# Patient Record
Sex: Female | Born: 1955 | Race: White | Hispanic: No | Marital: Married | Smoking: Former smoker
Health system: Southern US, Community
[De-identification: ages and names within clinical notes are randomized; demographics above are authoritative.]

## PROBLEM LIST (undated history)

## (undated) DIAGNOSIS — R42 Dizziness and giddiness: Secondary | ICD-10-CM

## (undated) DIAGNOSIS — E039 Hypothyroidism, unspecified: Secondary | ICD-10-CM

## (undated) DIAGNOSIS — E0500 Thyrotoxicosis with diffuse goiter without thyrotoxic crisis or storm: Secondary | ICD-10-CM

## (undated) DIAGNOSIS — Z9189 Other specified personal risk factors, not elsewhere classified: Secondary | ICD-10-CM

## (undated) DIAGNOSIS — Z808 Family history of malignant neoplasm of other organs or systems: Secondary | ICD-10-CM

## (undated) DIAGNOSIS — IMO0002 Reserved for concepts with insufficient information to code with codable children: Secondary | ICD-10-CM

## (undated) DIAGNOSIS — F32A Depression, unspecified: Secondary | ICD-10-CM

## (undated) DIAGNOSIS — E079 Disorder of thyroid, unspecified: Secondary | ICD-10-CM

## (undated) DIAGNOSIS — F329 Major depressive disorder, single episode, unspecified: Secondary | ICD-10-CM

## (undated) DIAGNOSIS — Z803 Family history of malignant neoplasm of breast: Secondary | ICD-10-CM

## (undated) DIAGNOSIS — B009 Herpesviral infection, unspecified: Secondary | ICD-10-CM

## (undated) HISTORY — PX: CATARACT EXTRACTION: SUR2

## (undated) HISTORY — DX: Depression, unspecified: F32.A

## (undated) HISTORY — DX: Dizziness and giddiness: R42

## (undated) HISTORY — DX: Major depressive disorder, single episode, unspecified: F32.9

## (undated) HISTORY — DX: Herpesviral infection, unspecified: B00.9

## (undated) HISTORY — PX: KIDNEY DONATION: SHX685

## (undated) HISTORY — DX: Family history of malignant neoplasm of other organs or systems: Z80.8

## (undated) HISTORY — DX: Family history of malignant neoplasm of breast: Z80.3

## (undated) HISTORY — PX: BREAST BIOPSY: SHX20

## (undated) HISTORY — DX: Disorder of thyroid, unspecified: E07.9

## (undated) HISTORY — DX: Other specified personal risk factors, not elsewhere classified: Z91.89

## (undated) HISTORY — PX: CATARACT EXTRACTION W/ INTRAOCULAR LENS IMPLANT: SHX1309

---

## 1994-09-28 DIAGNOSIS — E0500 Thyrotoxicosis with diffuse goiter without thyrotoxic crisis or storm: Secondary | ICD-10-CM

## 1994-09-28 HISTORY — DX: Thyrotoxicosis with diffuse goiter without thyrotoxic crisis or storm: E05.00

## 1999-03-25 ENCOUNTER — Other Ambulatory Visit: Admission: RE | Admit: 1999-03-25 | Discharge: 1999-03-25 | Payer: Self-pay | Admitting: Obstetrics and Gynecology

## 2000-05-22 ENCOUNTER — Other Ambulatory Visit: Admission: RE | Admit: 2000-05-22 | Discharge: 2000-05-22 | Payer: Self-pay | Admitting: Obstetrics and Gynecology

## 2001-07-19 ENCOUNTER — Other Ambulatory Visit: Admission: RE | Admit: 2001-07-19 | Discharge: 2001-07-19 | Payer: Self-pay | Admitting: Obstetrics and Gynecology

## 2002-07-21 ENCOUNTER — Other Ambulatory Visit: Admission: RE | Admit: 2002-07-21 | Discharge: 2002-07-21 | Payer: Self-pay | Admitting: Obstetrics and Gynecology

## 2003-08-02 ENCOUNTER — Other Ambulatory Visit: Admission: RE | Admit: 2003-08-02 | Discharge: 2003-08-02 | Payer: Self-pay | Admitting: Obstetrics and Gynecology

## 2004-08-26 ENCOUNTER — Other Ambulatory Visit: Admission: RE | Admit: 2004-08-26 | Discharge: 2004-08-26 | Payer: Self-pay | Admitting: Obstetrics and Gynecology

## 2004-10-22 ENCOUNTER — Encounter: Admission: RE | Admit: 2004-10-22 | Discharge: 2004-11-11 | Payer: Self-pay | Admitting: Family Medicine

## 2005-08-26 ENCOUNTER — Other Ambulatory Visit: Admission: RE | Admit: 2005-08-26 | Discharge: 2005-08-26 | Payer: Self-pay | Admitting: Obstetrics and Gynecology

## 2006-03-19 ENCOUNTER — Encounter: Admission: RE | Admit: 2006-03-19 | Discharge: 2006-03-19 | Payer: Self-pay | Admitting: Radiology

## 2006-09-15 ENCOUNTER — Encounter: Admission: RE | Admit: 2006-09-15 | Discharge: 2006-09-15 | Payer: Self-pay | Admitting: Radiology

## 2006-10-19 ENCOUNTER — Other Ambulatory Visit: Admission: RE | Admit: 2006-10-19 | Discharge: 2006-10-19 | Payer: Self-pay | Admitting: Obstetrics and Gynecology

## 2006-10-20 LAB — HIV ANTIBODY (ROUTINE TESTING W REFLEX): HIV: NEGATIVE

## 2007-10-21 ENCOUNTER — Other Ambulatory Visit: Admission: RE | Admit: 2007-10-21 | Discharge: 2007-10-21 | Payer: Self-pay | Admitting: Obstetrics & Gynecology

## 2009-02-22 HISTORY — PX: COLONOSCOPY W/ BIOPSIES: SHX1374

## 2010-10-10 ENCOUNTER — Ambulatory Visit: Payer: Self-pay | Admitting: Physical Medicine and Rehabilitation

## 2011-08-27 ENCOUNTER — Other Ambulatory Visit: Payer: Self-pay | Admitting: Obstetrics & Gynecology

## 2011-08-27 DIAGNOSIS — R109 Unspecified abdominal pain: Secondary | ICD-10-CM

## 2011-09-01 ENCOUNTER — Ambulatory Visit
Admission: RE | Admit: 2011-09-01 | Discharge: 2011-09-01 | Disposition: A | Discharge status: Home / Self Care | Payer: BC Managed Care – PPO | Source: Ambulatory Visit | Attending: Obstetrics & Gynecology | Admitting: Obstetrics & Gynecology

## 2011-09-01 DIAGNOSIS — R109 Unspecified abdominal pain: Secondary | ICD-10-CM

## 2011-09-01 MED ORDER — IOHEXOL 300 MG/ML  SOLN: 100.0000 mL | Freq: Once | INTRAMUSCULAR | Status: AC | PRN

## 2011-09-18 ENCOUNTER — Encounter (INDEPENDENT_AMBULATORY_CARE_PROVIDER_SITE_OTHER): Payer: Self-pay | Admitting: General Surgery

## 2011-09-19 ENCOUNTER — Encounter (INDEPENDENT_AMBULATORY_CARE_PROVIDER_SITE_OTHER): Payer: Self-pay | Admitting: General Surgery

## 2011-09-19 ENCOUNTER — Ambulatory Visit (INDEPENDENT_AMBULATORY_CARE_PROVIDER_SITE_OTHER): Payer: BC Managed Care – PPO | Admitting: General Surgery

## 2011-09-19 VITALS — BP 138/90 | HR 80 | Temp 98.0°F | Resp 12 | Ht 60.0 in | Wt 144.0 lb

## 2011-09-19 DIAGNOSIS — K388 Other specified diseases of appendix: Secondary | ICD-10-CM | POA: Insufficient documentation

## 2011-09-19 DIAGNOSIS — K389 Disease of appendix, unspecified: Secondary | ICD-10-CM

## 2011-09-19 NOTE — Progress Notes (Signed)
Patient ID: Kimberly Dorsey, female   DOB: [birth year], 56 y.o.   MRN: [id number]  Chief Complaint  Patient presents with  . Pre-op Exam    eval abd appy    HPI Kimberly Dorsey is a 56 y.o. female.  She is referred by Dr. 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 Dr. Viviann Spare sounds her endocrinologist.  The patient is generally healthy. She takes Synthroid for hypothyroidism. Last colonoscopy 2011 by Dr. Loreta Ave revealing polyps. Follow up in 5 years advised  Current problem is a 3 month history of intermittent right lower quadrant pain. Pain is somewhat sharp, can be  brief. The pain is increased by walking or sitting and goes away when she lies down. No history of endometriosis or Crohn's disease. No alteration of gastrointestinal or voiding habits. No fever or chills.  Transvaginal ultrasound was negative. CT scan showed abnormal appearance of the distal appendix but without any evidence of inflammation or adenopathy. Appendiceal tumor could not be excluded.  She is here with her husband to discuss management. HPI  Past Medical History  Diagnosis Date  . Thyroid disease     History reviewed. No pertinent past surgical history.  Family History  Problem Relation Age of Onset  . Cancer Mother     breast  . Hyperlipidemia Father   . Diabetes Father   . Hyperlipidemia Sister   . Diabetes Sister     Social History History  Substance Use Topics  . Smoking status: Former Games developer  . Smokeless tobacco: Never Used  . Alcohol Use: 0.0 oz/week     couple per week    No Known Allergies  Current Outpatient Prescriptions  Medication Sig Dispense Refill  . CYMBALTA 60 MG capsule       . traMADol (ULTRAM) 50 MG tablet         Review of Systems Review of Systems  Constitutional: Negative for fever, chills and unexpected weight change.  HENT: Negative for hearing loss, congestion, sore throat, trouble swallowing and voice  change.   Eyes: Negative for visual disturbance.  Respiratory: Negative for cough and wheezing.   Cardiovascular: Negative for chest pain, palpitations and leg swelling.  Gastrointestinal: Positive for abdominal pain. Negative for nausea, vomiting, diarrhea, constipation, blood in stool, abdominal distention and anal bleeding.  Genitourinary: Negative for hematuria, vaginal bleeding and difficulty urinating.  Musculoskeletal: Negative for arthralgias.  Skin: Negative for rash and wound.  Neurological: Negative for seizures, syncope and headaches.  Hematological: Negative for adenopathy. Does not bruise/bleed easily.  Psychiatric/Behavioral: Negative for confusion.    Blood pressure 138/90, pulse 80, temperature 98 F (36.7 C), temperature source Temporal, resp. rate 12, height 5' (1.524 m), weight 144 lb (65.318 kg).  Physical Exam Physical Exam  Constitutional: She is oriented to person, place, and time. She appears well-developed and well-nourished. No distress.  HENT:  Head: Normocephalic and atraumatic.  Nose: Nose normal.  Mouth/Throat: No oropharyngeal exudate.  Eyes: Conjunctivae and EOM are normal. Pupils are equal, round, and reactive to light. Left eye exhibits no discharge. No scleral icterus.  Neck: Neck supple. No JVD present. No tracheal deviation present. No thyromegaly present.  Cardiovascular: Normal rate, regular rhythm, normal heart sounds and intact distal pulses.   No murmur heard. Pulmonary/Chest: Effort normal and breath sounds normal. No respiratory distress. She has no wheezes. She has no rales. She exhibits no tenderness.  Abdominal: Soft. Bowel sounds are  normal. She exhibits no distension and no mass. There is no tenderness. There is no rebound and no guarding.       Subjectively tender in the right lower quadrant, no guarding, no mass. Not severe. No scars. No hernias.  Musculoskeletal: She exhibits no edema and no tenderness.  Lymphadenopathy:    She has  no cervical adenopathy.  Neurological: She is alert and oriented to person, place, and time. She exhibits normal muscle tone. Coordination normal.  Skin: Skin is warm. No rash noted. She is not diaphoretic. No erythema. No pallor.  Psychiatric: She has a normal mood and affect. Her behavior is normal. Judgment and thought content normal.    Data Reviewed Dr. Rondel Baton office notes. CT scan reviewed.  Assessment    Right lower quadrant abdominal pain, etiology unclear.  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 Differential diagnosis includes normal appendix, chronic appendicitis, Crohn's disease, carcinoid, adenocarcinoma.    Plan    The patient was advised to have an appendectomy and she and her husband agree with this  She will be scheduled for laparoscopic appendectomy, possible open, in the near future.  I discussed the indications, details, differential diagnoses, techniques, and numerous risks of this procedure. She understands these issues. Her questions were answered. She agrees with this plan.       Angelia Mould. Derrell Lolling, M.D., [facility] General and Minimally invasive Surgery Breast and Colorectal Surgery Office:   [phone] Pager:   [phone]  09/19/2011, 5:12 PM

## 2011-09-19 NOTE — Patient Instructions (Signed)
Your CT scan strongly suggests that there is an enlargement and possible mass of the tip of your appendix. As we discussed, there are cervical possible causes for this. No other abnormalities are seen on the CT scan.  Because your pain is in the right lower quadrant, where the appendix is, you are advised to have an appendectomy to remove the appendix. The surgery will clarify the diagnosis and possibly will treat your pain.  He will be scheduled for a laparoscopic appendectomy, possible open appendectomy.   Laparoscopic Appendectomy Appendectomy is surgery to remove the appendix. Laparoscopic surgery uses several small cuts (incisions) instead of one large incision. Laparoscopic surgery offers a shorter recovery time and less discomfort. LET YOUR CAREGIVER KNOW ABOUT:  Allergies to food or medicine.   Medicines taken, including vitamins, dietary supplements, herbs, eyedrops, over-the-counter medicines, and creams.   Use of steroids (by mouth or creams).   Previous problems with anesthetics or numbing medicines.   History of bleeding problems or blood clots.   Previous surgery.   Other health problems, including diabetes, heart problems, lung problems, and kidney problems.   Possibility of pregnancy, if this applies.  RISKS AND COMPLICATIONS  Infection. A germ starts growing in the wound. This can usually be treated with antibiotics. In some cases, the wound will need to be opened and cleaned.   Bleeding.   Damage to other organs.   Sores (abscesses).   Chronic pain at the incision sites. This is defined as pain that lasts for more than 3 months.   Blood clots in the legs that may rarely travel to the lungs.   Infection in the lungs (pneumonia).  BEFORE THE PROCEDURE Appendectomy is usually performed immediately after an inflamed appendix (appendicitis) is diagnosed. No preparation is necessary ahead of this procedure. PROCEDURE  You will be given medicine that makes you  sleep (general anesthetic). After you are asleep, a flexible tube (catheter) may be inserted into your bladder to drain your urine during surgery. The tube is removed before you wake up after surgery. When you are asleep, carbondioxide gas will be used to inflate your abdomen. This will allow your surgeon to see inside your abdomen and perform your surgery. Three small incisions will be made in your abdomen. Your surgeon will insert a thin, lighted tube (laparoscope) through one of the incisions. Your surgeon will look through the laparoscope while performing the surgery. Other tools will be inserted through the other incisions. Laparoscopic procedures may not be appropriate when:  There is major scarring from a previous surgery.   The patient has bleeding disorders.   A pregnancy is near term.   There are other conditions which make the laparoscopic procedure impossible, such as an advanced infection or a ruptured appendix.  If your surgeon feels it is not safe to continue with the laparoscopic procedure, he or she will perform an open surgery instead. This gives the surgeon a larger view and more space to work. Open surgery requires a longer recovery time. After your appendix is removed, your incisions will be closed with stitches (sutures) or skin adhesive. AFTER THE PROCEDURE You will be taken to a recovery room. When the anesthesia has worn off, you will be returned to your hospital room. You will be given pain medicines to keep you comfortable. Ask your caregiver how Shadd your hospital stay will be. Document Released: 08/28/2003 Document Revised: 01/02/2011 Document Reviewed: 07/23/2010 [facility] Patient Information 2012 [place], [place].

## 2011-09-22 ENCOUNTER — Encounter (HOSPITAL_COMMUNITY): Payer: Self-pay | Admitting: Pharmacy Technician

## 2011-09-22 NOTE — Patient Instructions (Signed)
20 IREAN KENDRICKS  09/22/2011   Your procedure is scheduled on:  10/03/11 0900am-1015am  Report to [facility] at 0630 AM.  Call this number if you have problems the morning of surgery: [phone]   Remember:   Do not eat food:After Midnight.  May have clear liquids:until Midnight .   Take these medicines the morning of surgery with A SIP OF WATER:    Do not wear jewelry, make-up or nail polish.  Do not wear lotions, powders, or perfumes.   Do not shave 48 hours prior to surgery.   Do not bring valuables to the hospital.  Contacts, dentures or bridgework may not be worn into surgery.     Patients discharged the day of surgery will not be allowed to drive home.  Name and phone number of your driver:  Special Instructions: CHG Shower Use Special Wash: 1/2 bottle night before surgery and 1/2 bottle morning of surgery. shower chin to toes with CHG.  Wash face and private parts with regular soap.    Please read over the following fact sheets that you were given: MRSA Information, coughing and deep breathing exercises, leg exercises

## 2011-09-23 ENCOUNTER — Encounter (HOSPITAL_COMMUNITY)
Admission: RE | Admit: 2011-09-23 | Discharge: 2011-09-23 | Disposition: A | Discharge status: Home / Self Care | Payer: BC Managed Care – PPO | Source: Ambulatory Visit | Attending: General Surgery | Admitting: General Surgery

## 2011-09-23 ENCOUNTER — Encounter (HOSPITAL_COMMUNITY): Payer: Self-pay

## 2011-09-23 HISTORY — DX: Hypothyroidism, unspecified: E03.9

## 2011-09-23 HISTORY — DX: Thyrotoxicosis with diffuse goiter without thyrotoxic crisis or storm: E05.00

## 2011-09-23 HISTORY — DX: Reserved for concepts with insufficient information to code with codable children: IMO0002

## 2011-09-23 LAB — URINALYSIS, ROUTINE W REFLEX MICROSCOPIC
Bilirubin Urine: NEGATIVE
Hgb urine dipstick: NEGATIVE
Ketones, ur: NEGATIVE mg/dL
Leukocytes, UA: NEGATIVE
Protein, ur: NEGATIVE mg/dL
Specific Gravity, Urine: 1.015 (ref 1.005–1.030)
Urobilinogen, UA: 0.2 mg/dL (ref 0.0–1.0)

## 2011-09-23 LAB — COMPREHENSIVE METABOLIC PANEL
Albumin: 4.1 g/dL (ref 3.5–5.2)
BUN: 16 mg/dL (ref 6–23)
Calcium: 9.8 mg/dL (ref 8.4–10.5)
Chloride: 101 mEq/L (ref 96–112)
Creatinine, Ser: 0.62 mg/dL (ref 0.50–1.10)
GFR calc Af Amer: >90 mL/min (ref >90)
Sodium: 139 mEq/L (ref 135–145)
Total Protein: 7.1 g/dL (ref 6.0–8.3)

## 2011-09-23 LAB — CBC WITH DIFFERENTIAL/PLATELET
Basophils Absolute: 0 10*3/uL (ref 0.0–0.1)
Basophils Relative: 0 % (ref 0–1)
Lymphs Abs: 3.7 10*3/uL (ref 0.7–4.0)
Monocytes Absolute: 1 10*3/uL (ref 0.1–1.0)
WBC: 10.4 10*3/uL (ref 4.0–10.5)

## 2011-09-23 LAB — SURGICAL PCR SCREEN: MRSA, PCR: NEGATIVE

## 2011-10-02 NOTE — H&P (Signed)
Kimberly Dorsey    MRN: [id number]   Description: 56 year old female  Provider: Ernestene Mention, MD  Department: [facility]      Diagnoses     Mass of appendix   - Primary    543.9      Vitals -      BP Pulse Temp Resp Ht Wt    138/90 80 98 F (36.7 C) (Temporal) 12 5' (1.524 m) 144 lb (65.318 kg)    BMI - 28.12 kg/m2                 History and Physical     Ernestene Mention, MD   Patient ID: Kimberly Dorsey, female   DOB: [birth year], 56 y.o.   MRN: [id number]                 HPI Kimberly Dorsey is a 56 y.o. female.  She is referred by Dr. 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 Dr. Viviann Spare sounds her endocrinologist.   The patient is generally healthy. She takes Synthroid for hypothyroidism. Last colonoscopy 2011 by Dr. Loreta Ave revealing polyps. Follow up in 5 years advised   Current problem is a 3 month history of intermittent right lower quadrant pain. Pain is somewhat sharp, can be  brief. The pain is increased by walking or sitting and goes away when she lies down. No history of endometriosis or Crohn's disease. No alteration of gastrointestinal or voiding habits. No fever or chills.   Transvaginal ultrasound was negative. CT scan showed abnormal appearance of the distal appendix but without any evidence of inflammation or adenopathy. Appendiceal tumor could not be excluded.   She is here with her husband to discuss management.         Past Medical History   Diagnosis  Date   .  Thyroid disease        History reviewed. No pertinent past surgical history.    Family History   Problem  Relation  Age of Onset   .  Cancer  Mother         breast   .  Hyperlipidemia  Father     .  Diabetes  Father     .  Hyperlipidemia  Sister     .  Diabetes  Sister        Social History History   Substance Use Topics   .  Smoking status:  Former Games developer   .  Smokeless tobacco:  Never Used     .  Alcohol Use:  0.0 oz/week         couple per week      No Known Allergies    Current Outpatient Prescriptions   Medication  Sig  Dispense  Refill   .  CYMBALTA 60 MG capsule           .  traMADol (ULTRAM) 50 MG tablet              Review of Systems   Constitutional: Negative for fever, chills and unexpected weight change.  HENT: Negative for hearing loss, congestion, sore throat, trouble swallowing and voice change.   Eyes: Negative for visual disturbance.  Respiratory: Negative for cough and wheezing.   Cardiovascular: Negative for chest pain, palpitations and leg swelling.  Gastrointestinal: Positive for abdominal pain. Negative for nausea, vomiting, diarrhea, constipation, blood in stool, abdominal distention and anal  bleeding.  Genitourinary: Negative for hematuria, vaginal bleeding and difficulty urinating.  Musculoskeletal: Negative for arthralgias.  Skin: Negative for rash and wound.  Neurological: Negative for seizures, syncope and headaches.  Hematological: Negative for adenopathy. Does not bruise/bleed easily.  Psychiatric/Behavioral: Negative for confusion.    Blood pressure 138/90, pulse 80, temperature 98 F (36.7 C), temperature source Temporal, resp. rate 12, height 5' (1.524 m), weight 144 lb (65.318 kg).   Physical Exam  Constitutional: She is oriented to person, place, and time. She appears well-developed and well-nourished. No distress.  HENT:   Head: Normocephalic and atraumatic.   Nose: Nose normal.   Mouth/Throat: No oropharyngeal exudate.  Eyes: Conjunctivae and EOM are normal. Pupils are equal, round, and reactive to light. Left eye exhibits no discharge. No scleral icterus.  Neck: Neck supple. No JVD present. No tracheal deviation present. No thyromegaly present.  Cardiovascular: Normal rate, regular rhythm, normal heart sounds and intact distal pulses.    No murmur heard. Pulmonary/Chest: Effort normal and breath sounds normal. No  respiratory distress. She has no wheezes. She has no rales. She exhibits no tenderness.  Abdominal: Soft. Bowel sounds are normal. She exhibits no distension and no mass. There is no tenderness. There is no rebound and no guarding.       Subjectively tender in the right lower quadrant, no guarding, no mass. Not severe. No scars. No hernias.  Musculoskeletal: She exhibits no edema and no tenderness.  Lymphadenopathy:    She has no cervical adenopathy.  Neurological: She is alert and oriented to person, place, and time. She exhibits normal muscle tone. Coordination normal.  Skin: Skin is warm. No rash noted. She is not diaphoretic. No erythema. No pallor.  Psychiatric: She has a normal mood and affect. Her behavior is normal. Judgment and thought content normal.    Data Reviewed Dr. Rondel Baton office notes. CT scan reviewed.   Assessment Right lower quadrant abdominal pain, etiology unclear.   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 Differential diagnosis includes normal appendix, chronic appendicitis, Crohn's disease, carcinoid, adenocarcinoma.   Plan The patient was advised to have an appendectomy and she and her husband agree with this   She will be scheduled for laparoscopic appendectomy, possible open, in the near future.   I discussed the indications, details, differential diagnoses, techniques, and numerous risks of this procedure. She understands these issues. Her questions were answered. She agrees with this plan.       Angelia Mould. Derrell Lolling, M.D., [facility] General and Minimally invasive Surgery Breast and Colorectal Surgery Office:   [phone] Pager:   [phone]

## 2011-10-03 ENCOUNTER — Ambulatory Visit (HOSPITAL_COMMUNITY)
Admission: RE | Admit: 2011-10-03 | Discharge: 2011-10-03 | Disposition: A | Discharge status: Hospice | Payer: BC Managed Care – PPO | Source: Ambulatory Visit | Attending: General Surgery | Admitting: General Surgery

## 2011-10-03 ENCOUNTER — Encounter (HOSPITAL_COMMUNITY)
Admission: RE | Disposition: A | Discharge status: Hospice | Payer: Self-pay | Source: Ambulatory Visit | Attending: General Surgery

## 2011-10-03 ENCOUNTER — Ambulatory Visit (HOSPITAL_COMMUNITY): Payer: BC Managed Care – PPO | Admitting: Anesthesiology

## 2011-10-03 ENCOUNTER — Encounter (HOSPITAL_COMMUNITY): Payer: Self-pay | Admitting: Anesthesiology

## 2011-10-03 ENCOUNTER — Encounter (HOSPITAL_COMMUNITY): Payer: Self-pay | Admitting: *Deleted

## 2011-10-03 DIAGNOSIS — R1031 Right lower quadrant pain: Secondary | ICD-10-CM | POA: Insufficient documentation

## 2011-10-03 DIAGNOSIS — Z01812 Encounter for preprocedural laboratory examination: Secondary | ICD-10-CM | POA: Insufficient documentation

## 2011-10-03 DIAGNOSIS — K389 Disease of appendix, unspecified: Secondary | ICD-10-CM | POA: Insufficient documentation

## 2011-10-03 DIAGNOSIS — K388 Other specified diseases of appendix: Secondary | ICD-10-CM | POA: Diagnosis present

## 2011-10-03 HISTORY — PX: LAPAROSCOPIC APPENDECTOMY: SHX408

## 2011-10-03 SURGERY — APPENDECTOMY, LAPAROSCOPIC
Anesthesia: General | Site: Abdomen | Wound class: Clean Contaminated

## 2011-10-03 MED ORDER — ACETAMINOPHEN 10 MG/ML IV SOLN
INTRAVENOUS | Status: AC
  Filled 2011-10-03: qty 100

## 2011-10-03 MED ORDER — BUPIVACAINE HCL (PF) 0.25 % IJ SOLN
INTRAMUSCULAR | Status: DC | PRN
  Administered 2011-10-03: 20 mL

## 2011-10-03 MED ORDER — CHLORHEXIDINE GLUCONATE 4 % EX LIQD
1.0000 "application " | Freq: Once | CUTANEOUS | Status: DC
  Filled 2011-10-03: qty 15

## 2011-10-03 MED ORDER — GLYCOPYRROLATE 0.2 MG/ML IJ SOLN
INTRAMUSCULAR | Status: DC | PRN
  Administered 2011-10-03: .6 mg via INTRAVENOUS

## 2011-10-03 MED ORDER — LIDOCAINE HCL (CARDIAC) 20 MG/ML IV SOLN
INTRAVENOUS | Status: DC | PRN
  Administered 2011-10-03: 100 mg via INTRAVENOUS

## 2011-10-03 MED ORDER — HEPARIN SODIUM (PORCINE) 5000 UNIT/ML IJ SOLN
5000.0000 [IU] | Freq: Once | INTRAMUSCULAR | Status: AC
  Administered 2011-10-03: 5000 [IU] via SUBCUTANEOUS
  Filled 2011-10-03: qty 1

## 2011-10-03 MED ORDER — DEXTROSE 5 % IV SOLN
2.0000 g | INTRAVENOUS | Status: AC
  Administered 2011-10-03: 2 g via INTRAVENOUS
  Filled 2011-10-03: qty 2

## 2011-10-03 MED ORDER — NEOSTIGMINE METHYLSULFATE 1 MG/ML IJ SOLN
INTRAMUSCULAR | Status: DC | PRN
  Administered 2011-10-03: 4 mg via INTRAVENOUS

## 2011-10-03 MED ORDER — PROPOFOL 10 MG/ML IV EMUL
INTRAVENOUS | Status: DC | PRN
  Administered 2011-10-03: 150 mg via INTRAVENOUS

## 2011-10-03 MED ORDER — BUPIVACAINE HCL (PF) 0.25 % IJ SOLN
INTRAMUSCULAR | Status: AC
  Filled 2011-10-03: qty 30

## 2011-10-03 MED ORDER — ROCURONIUM BROMIDE 100 MG/10ML IV SOLN
INTRAVENOUS | Status: DC | PRN
  Administered 2011-10-03: 30 mg via INTRAVENOUS
  Administered 2011-10-03: 5 mg via INTRAVENOUS

## 2011-10-03 MED ORDER — SUCCINYLCHOLINE CHLORIDE 20 MG/ML IJ SOLN
INTRAMUSCULAR | Status: DC | PRN
  Administered 2011-10-03: 100 mg via INTRAVENOUS

## 2011-10-03 MED ORDER — HYDROCODONE-ACETAMINOPHEN 5-325 MG PO TABS: 1.0000 | ORAL_TABLET | ORAL | Status: AC | PRN

## 2011-10-03 MED ORDER — 0.9 % SODIUM CHLORIDE (POUR BTL) OPTIME
TOPICAL | Status: DC | PRN
  Administered 2011-10-03: 1000 mL

## 2011-10-03 MED ORDER — FENTANYL CITRATE 0.05 MG/ML IJ SOLN
INTRAMUSCULAR | Status: DC | PRN
  Administered 2011-10-03: 100 ug via INTRAVENOUS
  Administered 2011-10-03 (×3): 50 ug via INTRAVENOUS

## 2011-10-03 MED ORDER — METOCLOPRAMIDE HCL 5 MG/ML IJ SOLN
INTRAMUSCULAR | Status: DC | PRN
  Administered 2011-10-03: 10 mg via INTRAVENOUS

## 2011-10-03 MED ORDER — LACTATED RINGERS IV SOLN
INTRAVENOUS | Status: DC | PRN
  Administered 2011-10-03 (×2): via INTRAVENOUS

## 2011-10-03 MED ORDER — ONDANSETRON HCL 4 MG/2ML IJ SOLN
INTRAMUSCULAR | Status: DC | PRN
  Administered 2011-10-03: 4 mg via INTRAVENOUS

## 2011-10-03 MED ORDER — DEXAMETHASONE SODIUM PHOSPHATE 10 MG/ML IJ SOLN
INTRAMUSCULAR | Status: DC | PRN
  Administered 2011-10-03: 10 mg via INTRAVENOUS

## 2011-10-03 MED ORDER — KETOROLAC TROMETHAMINE 30 MG/ML IJ SOLN
INTRAMUSCULAR | Status: DC | PRN
  Administered 2011-10-03: 30 mg via INTRAVENOUS

## 2011-10-03 MED ORDER — ACETAMINOPHEN 10 MG/ML IV SOLN
INTRAVENOUS | Status: DC | PRN
  Administered 2011-10-03: 1000 mg via INTRAVENOUS

## 2011-10-03 MED ORDER — LACTATED RINGERS IR SOLN
Status: DC | PRN
  Administered 2011-10-03: 1000 mL

## 2011-10-03 MED ORDER — MIDAZOLAM HCL 5 MG/5ML IJ SOLN
INTRAMUSCULAR | Status: DC | PRN
  Administered 2011-10-03: 2 mg via INTRAVENOUS

## 2011-10-03 SURGICAL SUPPLY — 36 items
APPLIER CLIP ROT 10 11.4 M/L (STAPLE)
BENZOIN TINCTURE PRP APPL 2/3 (GAUZE/BANDAGES/DRESSINGS) IMPLANT
CANISTER SUCTION 2500CC (MISCELLANEOUS) ×2 IMPLANT
CLIP APPLIE ROT 10 11.4 M/L (STAPLE) IMPLANT
CLOTH BEACON ORANGE TIMEOUT ST (SAFETY) ×2 IMPLANT
CUTTER FLEX LINEAR 45M (STAPLE) ×2 IMPLANT
DECANTER SPIKE VIAL GLASS SM (MISCELLANEOUS) ×2 IMPLANT
DERMABOND ADVANCED (GAUZE/BANDAGES/DRESSINGS) ×1
DERMABOND ADVANCED .7 DNX12 (GAUZE/BANDAGES/DRESSINGS) ×1 IMPLANT
DRAPE LAPAROSCOPIC ABDOMINAL (DRAPES) ×2 IMPLANT
ELECT REM PT RETURN 9FT ADLT (ELECTROSURGICAL) ×2
ELECTRODE REM PT RTRN 9FT ADLT (ELECTROSURGICAL) ×1 IMPLANT
ENDOLOOP SUT PDS II  0 18 (SUTURE)
ENDOLOOP SUT PDS II 0 18 (SUTURE) IMPLANT
GLOVE BIOGEL PI IND STRL 7.0 (GLOVE) ×1 IMPLANT
GLOVE BIOGEL PI INDICATOR 7.0 (GLOVE) ×1
GLOVE EUDERMIC 7 POWDERFREE (GLOVE) ×2 IMPLANT
GOWN STRL NON-REIN LRG LVL3 (GOWN DISPOSABLE) ×2 IMPLANT
GOWN STRL REIN XL XLG (GOWN DISPOSABLE) ×4 IMPLANT
HAND ACTIVATED (MISCELLANEOUS) ×2 IMPLANT
KIT BASIN OR (CUSTOM PROCEDURE TRAY) ×2 IMPLANT
PENCIL BUTTON HOLSTER BLD 10FT (ELECTRODE) IMPLANT
POUCH SPECIMEN RETRIEVAL 10MM (ENDOMECHANICALS) ×2 IMPLANT
RELOAD 45 VASCULAR/THIN (ENDOMECHANICALS) ×2 IMPLANT
RELOAD STAPLE TA45 3.5 REG BLU (ENDOMECHANICALS) IMPLANT
SET IRRIG TUBING LAPAROSCOPIC (IRRIGATION / IRRIGATOR) ×2 IMPLANT
SOLUTION ANTI FOG 6CC (MISCELLANEOUS) ×2 IMPLANT
STRIP CLOSURE SKIN 1/2X4 (GAUZE/BANDAGES/DRESSINGS) IMPLANT
SUT MNCRL AB 4-0 PS2 18 (SUTURE) ×2 IMPLANT
TOWEL OR 17X26 10 PK STRL BLUE (TOWEL DISPOSABLE) ×2 IMPLANT
TRAY FOLEY CATH 14FRSI W/METER (CATHETERS) ×2 IMPLANT
TRAY LAP CHOLE (CUSTOM PROCEDURE TRAY) ×2 IMPLANT
TROCAR BLADELESS OPT 5 75 (ENDOMECHANICALS) ×2 IMPLANT
TROCAR XCEL 12X100 BLDLESS (ENDOMECHANICALS) ×2 IMPLANT
TROCAR XCEL BLUNT TIP 100MML (ENDOMECHANICALS) ×2 IMPLANT
TUBING INSUFFLATION 10FT LAP (TUBING) ×2 IMPLANT

## 2011-10-03 NOTE — Interval H&P Note (Signed)
History and Physical Interval Note:  10/03/2011 8:53 AM  Kimberly Dorsey  has presented today for surgery, with the diagnosis of mass of appendix  The goals and the various methods of treatment have been discussed with the patient and family. After consideration of risks, benefits and other options for treatment, the patient has consented to  Procedure(s) (LRB) with comments: APPENDECTOMY LAPAROSCOPIC (N/A) - Laparoscopic Appendectomy Possible Open as a surgical intervention .  The patient's history has been reviewed, patient examined, no change in status, stable for surgery.  I have reviewed the patient's chart and labs.  Questions were answered to the patient's satisfaction.     Ernestene Mention

## 2011-10-03 NOTE — Op Note (Signed)
Patient Name:           Kimberly Dorsey   Date of Surgery:        10/03/2011  Pre op Diagnosis:      Mass of the appendix  Post op Diagnosis:    same  Procedure:                 Diagnostic laparoscopy, laparoscopic appendectomy  Surgeon:                     Angelia Mould. Derrell Lolling, M.D., FACS  Assistant:                      none  Operative Indications:   Kimberly Dorsey is a 56 y.o. female. She was referred by Dr. 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 Dr. Laurene Footman is her endocrinologist.  The patient is generally healthy. She takes Synthroid for hypothyroidism. Last colonoscopy 2011 by Dr. Loreta Ave revealing polyps. Follow up in 5 years advised  Current problem is a 3 month history of intermittent right lower quadrant pain. Pain is somewhat sharp, can be brief. The pain is increased by walking or sitting and goes away when she lies down. No history of endometriosis or Crohn's disease. No alteration of gastrointestinal or voiding habits. No fever or chills. Abdominal exam is basically unremarkable. Transvaginal ultrasound was negative. CT scan showed abnormal appearance of the distal appendix but without any evidence of inflammation or adenopathy. Appendiceal tumor could not be excluded.   Operative Findings:       The appendix appeared enlarged but was not inflamed and there was no abnormality of the serosa. There was no detectable mass of the appendix. The last 5 feet of terminal ileum were examined and were normal, no small bowel mass or diverticulae. The right colon, hepatic flexure, gallbladder, liver, stomach, sigmoid colon looked normal. The uterus and ovaries and fallopian tubes were basically normal to inspection. There was no other explanation for her right-sided abdominal pain.  Procedure in Detail:          Following the induction of general endotracheal anesthesia the patient had a Foley catheter inserted and the abdomen  was prepped and draped in a sterile fashion. Surgical time out was performed. Intravenous antibiotics were given. 0.5% Marcaine with epinephrine was used as local infiltration infiltration anesthetic.  A transverse incision was made at the superior rim of the umbilicus. The fascia was incised in the midline and the abdominal cavity entered under direct vision. An 11 mm Hassan trocar was inserted and secured with a pursestring suture of 0 Vicryl. Pneumoperitoneum was created and the camera inserted. A 5 mm trocar was placed in the left lower quadrant and a 12 mm trocar placed in the left suprapubic area.  I spent 10-15 minutes exploring the abdomen with findings as described above. I then elevated the appendix. I divided some of the lateral peritoneal attachments to allow the cecum to be mobilized medially. I divided the mesentery of the appendix with the Harmonic scalpel. When I could clearly see the junction of the appendix with the cecum I inserted an Endo GIA stapling device with a 2.5 mm staple height. This was placed transversely across the base of the appendix at the level of the cecum and closed, held in place for 30 second, fired and removed. The appendix was placed in a specimen  bag and removed and sent to pathology with the appropriate history. A few staples were loose and were removed. The staple line on the cecum looked good we irrigated the right lower quadrant. The small bowel and sigmoid colon and uterus were inspected  one more time and everything looked fine. I removed the irrigation fluid, released the pneumoperitoneum and removed the trocars. The fascia at the umbilicus was closed with 0 Vicryl sutures. After irrigating the trocar sites I closed them with subcuticular sutures of 4-0 Monocryl and Dermabond. The patient tolerated the procedure well and was taken to recovery room in stable condition. There were no complications. EBL 10 cc. Counts correct.     Angelia Mould. Derrell Lolling, M.D.,  FACS General and Minimally Invasive Surgery Breast and Colorectal Surgery  10/03/2011 10:02 AM

## 2011-10-03 NOTE — Anesthesia Preprocedure Evaluation (Addendum)
Anesthesia Evaluation  Patient identified by MRN, date of birth, ID band Patient awake    Reviewed: Allergy & Precautions, H&P , NPO status , Patient's Chart, lab work & pertinent test results  Airway Mallampati: III TM Distance: >3 FB Neck ROM: Full    Dental  (+) Teeth Intact and Dental Advisory Given   Pulmonary neg pulmonary ROS,  breath sounds clear to auscultation  Pulmonary exam normal       Cardiovascular negative cardio ROS  Rhythm:Regular Rate:Normal     Neuro/Psych negative neurological ROS  negative psych ROS   GI/Hepatic negative GI ROS, Neg liver ROS,   Endo/Other  Hypothyroidism   Renal/GU negative Renal ROS  negative genitourinary   Musculoskeletal negative musculoskeletal ROS (+)   Abdominal   Peds negative pediatric ROS (+)  Hematology negative hematology ROS (+)   Anesthesia Other Findings   Reproductive/Obstetrics negative OB ROS                          Anesthesia Physical Anesthesia Plan  ASA: II  Anesthesia Plan: General   Post-op Pain Management:    Induction: Intravenous  Airway Management Planned: Oral ETT  Additional Equipment:   Intra-op Plan:   Post-operative Plan: Extubation in OR  Informed Consent: I have reviewed the patients History and Physical, chart, labs and discussed the procedure including the risks, benefits and alternatives for the proposed anesthesia with the patient or authorized representative who has indicated his/her understanding and acceptance.   Dental advisory given  Plan Discussed with: CRNA  Anesthesia Plan Comments:         Anesthesia Quick Evaluation

## 2011-10-03 NOTE — Transfer of Care (Signed)
Immediate Anesthesia Transfer of Care Note  Patient: Kimberly Dorsey  Procedure(s) Performed: Procedure(s) (LRB) with comments: APPENDECTOMY LAPAROSCOPIC (N/A)  Patient Location: PACU  Anesthesia Type: General  Level of Consciousness: awake, alert  and oriented  Airway & Oxygen Therapy: Patient Spontanous Breathing and Patient connected to face mask oxygen  Post-op Assessment: Report given to PACU RN and Post -op Vital signs reviewed and stable  Post vital signs: Reviewed and stable  Complications: No apparent anesthesia complications

## 2011-10-03 NOTE — Preoperative (Signed)
Beta Blockers   Reason not to administer Beta Blockers:Not Applicable 

## 2011-10-03 NOTE — Anesthesia Postprocedure Evaluation (Signed)
Anesthesia Post Note  Patient: Kimberly Dorsey  Procedure(s) Performed: Procedure(s) (LRB): APPENDECTOMY LAPAROSCOPIC (N/A)  Anesthesia type: General  Patient location: PACU  Post pain: Pain level controlled  Post assessment: Post-op Vital signs reviewed  Last Vitals:  Filed Vitals:   10/03/11 1015  BP: 137/69  Pulse: 102  Temp:   Resp: 17    Post vital signs: Reviewed  Level of consciousness: sedated  Complications: No apparent anesthesia complications

## 2011-10-06 ENCOUNTER — Encounter (HOSPITAL_COMMUNITY): Payer: Self-pay | Admitting: General Surgery

## 2011-10-06 ENCOUNTER — Telehealth (INDEPENDENT_AMBULATORY_CARE_PROVIDER_SITE_OTHER): Payer: Self-pay | Admitting: General Surgery

## 2011-10-06 NOTE — Progress Notes (Signed)
Quick Note:  Inform patient of Pathology report,. "no cancer, just chronic scar tissue."  Make sure she's doing OK from the surgery. ______

## 2011-10-06 NOTE — Telephone Encounter (Signed)
Called and left message for patient advising call was being returned pertaining to post op appointment needed. Advised in message appointment set up for 10/17/11 at 9:15.

## 2011-10-07 ENCOUNTER — Telehealth (INDEPENDENT_AMBULATORY_CARE_PROVIDER_SITE_OTHER): Payer: Self-pay | Admitting: General Surgery

## 2011-10-07 NOTE — Telephone Encounter (Signed)
Called patient to advise of pathology report. Per Dr. Derrell Lolling "no cancer, just chronic scar tissue". Confirmed patient to see Dr. Derrell Lolling next week on 10/17/11. Advised her a copy of the pathology report will be given to her at the appointment. Patient agreed.

## 2011-10-17 ENCOUNTER — Ambulatory Visit (INDEPENDENT_AMBULATORY_CARE_PROVIDER_SITE_OTHER): Payer: BC Managed Care – PPO | Admitting: General Surgery

## 2011-10-17 ENCOUNTER — Encounter (INDEPENDENT_AMBULATORY_CARE_PROVIDER_SITE_OTHER): Payer: Self-pay | Admitting: General Surgery

## 2011-10-17 VITALS — BP 108/68 | HR 80 | Temp 98.4°F | Resp 16 | Ht 60.0 in | Wt 142.6 lb

## 2011-10-17 DIAGNOSIS — K389 Disease of appendix, unspecified: Secondary | ICD-10-CM

## 2011-10-17 DIAGNOSIS — K388 Other specified diseases of appendix: Secondary | ICD-10-CM

## 2011-10-17 NOTE — Progress Notes (Signed)
Patient ID: Kimberly Dorsey, female   DOB: Aug 07, 1955, 56 y.o.   MRN: [id number] History: this patient presented with some vague abdominal symptoms and a CT scan suggested a mass at the tip of the appendix. She underwent elective laparoscopic appendectomy on 10/03/2011. 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 She is recovering uneventfully but states that she still has her right-sided abdominal pain. She wonders if it's her bulging L4 disc.  Another issue that she wanted to discuss with chronic constipation. This is been going on for years. Her bowel movements or hard. She strains to have stool. She occasionally sees a little bit of rectal bleeding. She had a colonoscopy a few years ago. She takes daily stool softeners but nothing else.  Exam:  Patient looks well. She is in no distress. Mental status normal. Abdomen is soft, nontender, not distended, all wounds healing well.  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 No malignancy or inflammatory bowel disease found. It is doubtful that her appendix was causing her chronic pain. Chronic constipation L4 degenerative disc disease. Anatomically, this should not cause abdominal pain.  Plan:she was given a copy of her pathology report and this was discussed with her. She may resume normal physical activities and exercise. She was advised and high fiber, low fat diet, port migration, and supplemental fiber for her constipation She was advised that if a right lower quadrant pain persists more than 3 or 4 weeks to return to her primary care physician. Return to see me when necessary   Angelia Mould. Derrell Lolling, M.D., [facility] General and Minimally invasive Surgery Breast and Colorectal Surgery Office:   [phone] Pager:   [phone]

## 2011-10-17 NOTE — Patient Instructions (Signed)
The final pathology report on your appendix shows benign changes. There is no tumor or evidence of inflammatory bowel disease. You have been given a copy of the pathology report.   It is completely unknown whether this was causing a right lower quadrant pain. At surgery there was no abnormality of any other organ that I could detect.  I advise you to resume normal daily exercise without restriction.  In terms of your chronic constipation, you are advised to adhere to a high fiber, low fat diet, and drink 6-7 glasses of water a day, and to take supplemental fiber such as Metamucil twice a day. You may use MiraLAX as needed.  You are  advised to discuss your current medications with your primary care physician to see if they may be contributing to your  constipation.  Return to see Dr. Derrell Lolling if further surgical problems arise.

## 2012-04-27 ENCOUNTER — Encounter: Payer: Self-pay | Admitting: Nurse Practitioner

## 2012-05-03 ENCOUNTER — Encounter: Payer: Self-pay | Admitting: Nurse Practitioner

## 2012-05-03 ENCOUNTER — Ambulatory Visit (INDEPENDENT_AMBULATORY_CARE_PROVIDER_SITE_OTHER): Payer: BC Managed Care – PPO | Admitting: Nurse Practitioner

## 2012-05-03 VITALS — BP 110/68 | Resp 16 | Wt 151.0 lb

## 2012-05-03 DIAGNOSIS — N952 Postmenopausal atrophic vaginitis: Secondary | ICD-10-CM

## 2012-05-03 NOTE — Progress Notes (Deleted)
56 yrsMarried{Race/ethnicity:[id number]}female here to discuss intitation of  HRT. No LMP recorded. Patient is not currently having periods (Reason: Perimenopausal). Jan 2013 Her previous cycles have been {Regular/irregular menstrual period abdominal pain hpi md:30583:x}  Surgical removal of uterus{yes no:314532}. Her symptoms include {Menopause s/s:16166}. These have been present for {numbers 1-12:19994}{gen duration 2:310041:x}.   Her medical history is significant for:   Heart disease{yes no:314532} DVT {yes no:314532} Hypertension {yes no:314532} Osteopenia/ osteoporosis {yes no:314532} Breast Cancer {yes no:314532} Other prior cancer within 10 yrs {yes no:314532} Adherence and retention concerns {yes no:314532} Current Mammogram {yes no:314532} Current Annual Exam {yes no:314532} Low hematocrit or platelet count {yes no:314532} Elevated triglyceride level {yes no:314532} Other bio-identical current Hormonal treatment {yes no:314532}    Family Medical history is significant for:  Breast Cancer {yes no:314532} Ovarian Cancer {yes no:314532} Colon cancer {yes no:314532} Cardiovascular disease {yes no:314532}   Discussed WHI study with negative and positive benefits and risk of HRT including heart disease, CVA ,DVT and breast cancer.  Adverse events and side effects of hormone therapy:  Uterine bleeding expectations in combination therapies is 6 - 12 months, 40% and 60% respectively will be amenorrheic.  If bleeding continues after 6 months to call back. Use of estrogen may increase the risk of Gallbladder disease. No unopposed estrogen dosing.  Labs:   TSH {yes no:314532}   FSH {yes no:314532}   Pregnancy test {yes no:314532}   AMH {yes no:314532}  Plan: After discussion Patient is started on HRT/ ERT with Recheck hormonal response in {TIME 1:61096045}  A letter of summary is given to patient

## 2012-05-03 NOTE — Patient Instructions (Addendum)
Atrophic Vaginitis Atrophic vaginitis is a problem of low levels of estrogen in women. This problem can happen at any age. It is most common in women who have gone through menopause ("the change").  HOW WILL I KNOW IF I HAVE THIS PROBLEM? You may have:  Trouble with peeing (urinating), such as:  Going to the bathroom often.  A hard time holding your pee until you reach a bathroom.  Leaking pee.  Having pain when you pee.  Itching or a burning feeling.  Vaginal bleeding and spotting.  Pain during sex.  Dryness of the vagina.  A yellow, bad-smelling fluid (discharge) coming from the vagina. HOW WILL MY DOCTOR CHECK FOR THIS PROBLEM?  During your exam, your doctor will likely find the problem.  If there is a vaginal fluid, it may be checked for infection. HOW WILL THIS PROBLEM BE TREATED? Keep the vulvar skin as clean as possible. Moisturizers and lubricants can help with some of the symptoms. Estrogen replacement can help. There are 2 ways to take estrogen:  Systemic estrogen gets estrogen to your whole body. It takes many weeks or months before the symptoms get better.  You take an estrogen pill.  You use a skin patch. This is a patch that you put on your skin.  If you still have your uterus, your doctor may ask you to take a hormone. Talk to your doctor about the right medicine for you.  Estrogen cream.  This puts estrogen only at the part of your body where you apply it. The cream is put into the vagina or put on the vulvar skin. For some women, estrogen cream works faster than pills or the patch. CAN ALL WOMEN WITH THIS PROBLEM USE ESTROGEN? No. Women with certain types of cancer, liver problems, or problems with blood clots should not take estrogen. Your doctor can help you decide the best treatment for your symptoms. Document Released: 07/02/2007 Document Revised: 04/07/2011 Document Reviewed: 07/02/2007 Exit[employer] Patient Information 2013 [employer].  

## 2012-05-04 ENCOUNTER — Encounter: Payer: Self-pay | Admitting: Nurse Practitioner

## 2012-05-04 NOTE — Progress Notes (Signed)
Patient ID: Kimberly Dorsey, female   DOB: 1955/08/21, 57 y.o.   MRN: [id number] S:  This patient presents for a consult to discuss atrophic vaginitis.  She is experiencing symptoms of extreme vaginal dryness and dyspareunia.   Symptoms that have been worsening over the past several months.  Her LMP was 01/2011. Vaso symptoms have been off/ on and are now tolerable.  She has first degree [facility] of breast cancer with her mother and has never been on  HRT.  P: Discussed etiology and how atrophy occurs. We discussed various types of estrogen vaginal replacement and she would like to try Estrace vaginal cream to start with.  Given a sample and will do 1  GM intravaginally 2 times a week.  After about 4-6 wk's. discussed lowering dose to 1/2 gm.  She is comfortable with this plan. Rx was written out since she has a mail in pharmacy not on our list, she will submit Rx herself.  Re-evaluate at next AEX in October.

## 2012-05-05 NOTE — Progress Notes (Signed)
Encounter reviewed by Dr. Brook Silva.  

## 2012-11-04 ENCOUNTER — Ambulatory Visit: Payer: Self-pay | Admitting: Nurse Practitioner

## 2012-11-16 ENCOUNTER — Encounter: Payer: Self-pay | Admitting: Nurse Practitioner

## 2012-11-16 ENCOUNTER — Ambulatory Visit (INDEPENDENT_AMBULATORY_CARE_PROVIDER_SITE_OTHER): Payer: BC Managed Care – PPO | Admitting: Nurse Practitioner

## 2012-11-16 VITALS — BP 100/60 | HR 68 | Resp 16 | Ht 60.5 in | Wt 150.0 lb

## 2012-11-16 DIAGNOSIS — Z01419 Encounter for gynecological examination (general) (routine) without abnormal findings: Secondary | ICD-10-CM

## 2012-11-16 DIAGNOSIS — F3289 Other specified depressive episodes: Secondary | ICD-10-CM

## 2012-11-16 DIAGNOSIS — N952 Postmenopausal atrophic vaginitis: Secondary | ICD-10-CM

## 2012-11-16 DIAGNOSIS — F329 Major depressive disorder, single episode, unspecified: Secondary | ICD-10-CM

## 2012-11-16 DIAGNOSIS — Z0000 Encounter for general adult medical examination without abnormal findings: Secondary | ICD-10-CM

## 2012-11-16 DIAGNOSIS — E039 Hypothyroidism, unspecified: Secondary | ICD-10-CM | POA: Insufficient documentation

## 2012-11-16 LAB — HEMOGLOBIN, FINGERSTICK: Hemoglobin, fingerstick: 14.4 g/dL (ref 12.0–16.0)

## 2012-11-16 LAB — POCT URINALYSIS DIPSTICK
Bilirubin, UA: NEGATIVE
Ketones, UA: NEGATIVE
Leukocytes, UA: NEGATIVE

## 2012-11-16 MED ORDER — ESTRADIOL 0.1 MG/GM VA CREA: 1.0000 g | TOPICAL_CREAM | VAGINAL | Status: DC

## 2012-11-16 NOTE — Progress Notes (Signed)
Patient ID: Kimberly Dorsey, female   DOB: 1955-12-02, 57 y.o.   MRN: [id number] 57 y.o. G4P0. Married Caucasian Fe here for annual exam.  LMP 1 2013  with last Provera challenge 08/2011 without withdrawal.  No vaginal bleeding since. Vaso symptoms are mild and tolerable. FSH at 147.2 on 10/2011.  No LMP recorded. Patient is not currently having periods (Reason: Perimenopausal).          Sexually active: yes  The current method of family planning is post menopausal status.    Exercising: yes  Home exercise routine includes walking and weights. Smoker:  no  Health Maintenance: Pap: 11/04/11, WNL, neg HR HPV MMG: 04/15/12, Bi-Rads 1: negative [facility] Colonoscopy: 02/22/09, tubular adenoma, repeat 5 years BMD: never TDaP: 10/29/09 Labs: HB: 14.4  Urine: negative, pH 6.0   reports that she has quit smoking. She has never used smokeless tobacco. She reports that she drinks alcohol. She reports that she does not use illicit drugs.  Past Medical History  Diagnosis Date  . Thyroid disease   . Hypothyroidism   . Bulging disc     lower back   . Graves disease     hx of   . HSV infection   . Depression     situational    Past Surgical History  Procedure Laterality Date  . Laparoscopic appendectomy  10/03/2011    Procedure: APPENDECTOMY LAPAROSCOPIC;  Surgeon: Ernestene Mention, MD;  Location: [facility];  Service: General;  Laterality: N/A;  . Colonoscopy w/ biopsies  02/22/2009    tubular adenoma    Current Outpatient Prescriptions  Medication Sig Dispense Refill  . CYMBALTA 60 MG capsule Take 60 mg by mouth daily before breakfast.       . levothyroxine (SYNTHROID, LEVOTHROID) 137 MCG tablet Take 137 mcg by mouth daily before breakfast.      . traMADol (ULTRAM) 50 MG tablet Take 50 mg by mouth as needed. Pain       No current facility-administered medications for this visit.    Family History  Problem Relation Age of Onset  . Cancer Mother 43    breast  . Hyperlipidemia Father   . Diabetes  Father   . Hyperlipidemia Sister   . Diabetes Sister     ROS:  Pertinent items are noted in HPI.  Otherwise, a comprehensive ROS was negative.  Exam:   BP 100/60  Pulse 68  Resp 16  Ht 5' 0.5" (1.537 m)  Wt 150 lb (68.04 kg)  BMI 28.8 kg/m2 Height: 5' 0.5" (153.7 cm)  Ht Readings from Last 3 Encounters:  11/16/12 5' 0.5" (1.537 m)  10/17/11 5' (1.524 m)  09/23/11 5' (1.524 m)    General appearance: alert, cooperative and appears stated age Head: Normocephalic, without obvious abnormality, atraumatic Neck: no adenopathy, supple, symmetrical, trachea midline and thyroid normal to inspection and palpation Lungs: clear to auscultation bilaterally Breasts: normal appearance, no masses or tenderness Heart: regular rate and rhythm Abdomen: soft, non-tender; no masses,  no organomegaly Extremities: extremities normal, atraumatic, no cyanosis or edema Skin: Skin color, texture, turgor normal. No rashes or lesions Lymph nodes: Cervical, supraclavicular, and axillary nodes normal. No abnormal inguinal nodes palpated Neurologic: Grossly normal   Pelvic: External genitalia:  no lesions              Urethra:  normal appearing urethra with no masses, tenderness or lesions              Bartholin's and Skene's:  normal                 Vagina: normal appearing vagina with normal color and discharge, no lesions              Cervix: anteverted              Pap taken: no Bimanual Exam:  Uterus:  normal size, contour, position, consistency, mobility, non-tender              Adnexa: no mass, fullness, tenderness               Rectovaginal: Confirms               Anus:  normal sphincter tone, no lesions  A:  Well Woman with normal exam  Postmenopausal no HRT  Situational depression  Hypothyroid on replacement  Atrophic vaginitis  P:   Pap smear as per guidelines   Mammogram due 3/15  Refill on Estrace vaginal cream for a year  Counseled on breast self exam, adequate intake of calcium and  vitamin D, diet and exercise, Kegel's exercises return annually or prn  An After Visit Summary was printed and given to the patient.

## 2012-11-16 NOTE — Patient Instructions (Signed)

## 2012-11-21 NOTE — Progress Notes (Signed)
Encounter reviewed by Dr. Takiesha Mcdevitt Silva.  

## 2013-11-28 ENCOUNTER — Encounter: Payer: Self-pay | Admitting: Nurse Practitioner

## 2013-11-29 ENCOUNTER — Ambulatory Visit (INDEPENDENT_AMBULATORY_CARE_PROVIDER_SITE_OTHER): Payer: BC Managed Care – PPO | Admitting: Nurse Practitioner

## 2013-11-29 ENCOUNTER — Encounter: Payer: Self-pay | Admitting: Nurse Practitioner

## 2013-11-29 VITALS — BP 120/84 | HR 80 | Ht 60.5 in | Wt 154.0 lb

## 2013-11-29 DIAGNOSIS — Z0000 Encounter for general adult medical examination without abnormal findings: Secondary | ICD-10-CM

## 2013-11-29 DIAGNOSIS — Z01419 Encounter for gynecological examination (general) (routine) without abnormal findings: Secondary | ICD-10-CM

## 2013-11-29 LAB — POCT URINALYSIS DIPSTICK
BILIRUBIN UA: NEGATIVE
Glucose, UA: NEGATIVE
KETONES UA: NEGATIVE
Leukocytes, UA: NEGATIVE
Nitrite, UA: NEGATIVE
PH UA: 6
Protein, UA: NEGATIVE
RBC UA: NEGATIVE
Urobilinogen, UA: NEGATIVE

## 2013-11-29 LAB — HEMOGLOBIN, FINGERSTICK: Hemoglobin, fingerstick: 14.9 g/dL (ref 12.0–16.0)

## 2013-11-29 MED ORDER — ESTRADIOL 0.1 MG/GM VA CREA: TOPICAL_CREAM | VAGINAL | Status: DC

## 2013-11-29 MED ORDER — VALACYCLOVIR HCL 1 G PO TABS: 1000.0000 mg | ORAL_TABLET | Freq: Every day | ORAL | Status: DC

## 2013-11-29 NOTE — Patient Instructions (Signed)

## 2013-11-29 NOTE — Progress Notes (Signed)
Patient ID: Kimberly Dorsey, female   DOB: 07-04-1955, 58 y.o.   MRN: [id number] 58 y.o. G40P0040 Married Caucasian Fe here for annual exam  Husband had stroke in April at age 64.  Still with tingling and heaviness in left side and loss of hearing in right ear.  Patient has been very stressed and also having work stress. Some increase in HSV outbreak with increased stress.  She is not seeing ortho for back problems.  She wants [place] to refill her Cymbalta - but she also wants to change to another kind of med because she is having "zingers" in her head.  She thinks the Cymbalta is giving her a fleeting pain in her head that last only for a few seconds and gone again.   Usually occurs 1-2 times a month.  Patient's last menstrual period was 01/28/2011 (approximate).          Sexually active: yes  The current method of family planning is post menopausal status.  Exercising: no regular exercise. Smoker: no  Health Maintenance: Pap: 11/04/11, WNL, neg HR HPV MMG: 05/03/13, Bi-Rads 1:  Negative  Colonoscopy: 02/22/09, tubular adenoma, repeat 5 years TDaP: 10/29/09 Labs:  HB:  14.9  Urine:  Negative    reports that she has quit smoking. She has never used smokeless tobacco. She reports that she drinks about 2.4 oz of alcohol per week. She reports that she does not use illicit drugs.  Past Medical History  Diagnosis Date  . Bulging disc     lower back   . HSV infection   . Depression     situational  . Thyroid disease   . Hypothyroidism   . Graves disease 9/96    hx of RAI    Past Surgical History  Procedure Laterality Date  . Laparoscopic appendectomy  10/03/2011    Procedure: APPENDECTOMY LAPAROSCOPIC;  Surgeon: Adin Hector, MD;  Location: [facility];  Service: General;  Laterality: N/A;  . Colonoscopy w/ biopsies  02/22/2009    tubular adenoma    Current Outpatient Prescriptions  Medication Sig Dispense Refill  . CYMBALTA 60 MG capsule Take 60 mg by mouth daily before breakfast.     .  estradiol (ESTRACE VAGINAL) 0.1 MG/GM vaginal cream 0.25 mg intravaginally twice weekly 42.5 g 3  . levothyroxine (SYNTHROID, LEVOTHROID) 137 MCG tablet Take 137 mcg by mouth daily before breakfast.    . traMADol (ULTRAM) 50 MG tablet Take 50 mg by mouth as needed. Pain    . valACYclovir (VALTREX) 1000 MG tablet Take 1 tablet (1,000 mg total) by mouth daily. 90 tablet 3   No current facility-administered medications for this visit.    Family History  Problem Relation Age of Onset  . Breast cancer Mother 22    breast - died age 10  . Hyperlipidemia Father   . Hyperlipidemia Sister   . Diabetes Sister   . Breast cancer Sister 18    Lumpectomy and Radiation  . Diabetes Brother   . Diabetes Sister     ROS:  Pertinent items are noted in HPI.  Otherwise, a comprehensive ROS was negative.  Exam:   BP 120/84 mmHg  Pulse 80  Ht 5' 0.5" (1.537 m)  Wt 154 lb (69.854 kg)  BMI 29.57 kg/m2  LMP 01/28/2011 (Approximate) Height: 5' 0.5" (153.7 cm)  Ht Readings from Last 3 Encounters:  11/29/13 5' 0.5" (1.537 m)  11/16/12 5' 0.5" (1.537 m)  10/17/11 5' (1.524 m)    General  appearance: alert, cooperative and appears stated age Head: Normocephalic, without obvious abnormality, atraumatic Neck: no adenopathy, supple, symmetrical, trachea midline and thyroid normal to inspection and palpation Lungs: clear to auscultation bilaterally Breasts: normal appearance, no masses or tenderness Heart: regular rate and rhythm Abdomen: soft, non-tender; no masses,  no organomegaly Extremities: extremities normal, atraumatic, no cyanosis or edema Skin: Skin color, texture, turgor normal. No rashes or lesions Lymph nodes: Cervical, supraclavicular, and axillary nodes normal. No abnormal inguinal nodes palpated Neurologic: Grossly normal   Pelvic: External genitalia:  no lesions              Urethra:  normal appearing urethra with no masses, tenderness or lesions              Bartholin's and Skene's:  normal                 Vagina: normal appearing vagina with normal color and discharge, no lesions              Cervix: anteverted              Pap taken: No. Bimanual Exam:  Uterus:  normal size, contour, position, consistency, mobility, non-tender              Adnexa: no mass, fullness, tenderness               Rectovaginal: Confirms               Anus:  normal sphincter tone, no lesions  A:  Well Woman with normal exam  Postmenopausal no HRT Situational depression Hypothyroid on replacement Atrophic vaginitis  Sister with diagnosis of breast cancer age 55  P:   Reviewed health and wellness pertinent to exam  Pap smear taken today  Mammogram is due 4/16,  She will find out sister's BRCA testing  Refill on Estrace vaginal cream for a year  Refill on Valtrex to use as directed for a year  Declined request for a refill on Cymbalta and advised her to see PCP about 'zingers'  Counseled on breast self exam, mammography screening, adequate intake of calcium and vitamin D, diet and exercise return annually or prn  An After Visit Summary was printed and given to the patient.

## 2013-11-30 NOTE — Progress Notes (Signed)
Encounter reviewed by Dr. Koya Hunger Silva.  

## 2013-11-30 NOTE — Progress Notes (Signed)
Estrace Vaginal cream prescription # 42.5 gm/3 rfs failed to e-prescribe called into [facility].

## 2013-12-02 ENCOUNTER — Telehealth: Payer: Self-pay | Admitting: *Deleted

## 2013-12-02 NOTE — Telephone Encounter (Signed)
Clarification request received from [facility] regarding ESTACE CREAM.  RX was sent on 11/29/13 with directions: 0.25 mg intravaginally twice weekly  Per [facility] applicator is graduated in doses of 1, 2, 3, or 4 grams.  Per history with [facility] directions previously stated:   USE ONE-FOURTH APPLICATORFUL VAGINALLY TWICE WEEKLY  Please clarify instructions.  Thanks.

## 2013-12-02 NOTE — Telephone Encounter (Signed)
Should be 1/4 applicator

## 2013-12-05 NOTE — Telephone Encounter (Signed)
Called [facility] and s/w Aurea GraffJoan they were wanting to clarify directions for Estrace Vaginal Cream.  Told them it was supposed to be (use 1/4 applicatorful vaginally twice weekly) Estrace 0.1 gm #42.5 gm/3 refills  Routed to provider for review, encounter closed. CC: Ms. Kimberly BlaseDebbie

## 2013-12-05 NOTE — Telephone Encounter (Signed)
Patient calling stating [facility] needs us to call them about this RX reference #: [id number].

## 2014-01-09 DIAGNOSIS — M545 Low back pain, unspecified: Secondary | ICD-10-CM | POA: Insufficient documentation

## 2014-01-09 DIAGNOSIS — E039 Hypothyroidism, unspecified: Secondary | ICD-10-CM | POA: Insufficient documentation

## 2014-01-09 DIAGNOSIS — E7800 Pure hypercholesterolemia, unspecified: Secondary | ICD-10-CM | POA: Insufficient documentation

## 2014-06-02 ENCOUNTER — Other Ambulatory Visit: Payer: Self-pay | Admitting: Specialist

## 2014-06-02 DIAGNOSIS — M25561 Pain in right knee: Secondary | ICD-10-CM

## 2014-06-15 ENCOUNTER — Telehealth: Payer: Self-pay | Admitting: Nurse Practitioner

## 2014-06-15 NOTE — Telephone Encounter (Signed)
[facility] and she has a low TSH.  She is aware of results and has already spoke t nurse at Dr. Rinaldo [employer]'s office.

## 2014-06-20 ENCOUNTER — Inpatient Hospital Stay: Admission: RE | Admit: 2014-06-20 | Payer: Self-pay | Source: Ambulatory Visit

## 2014-07-10 ENCOUNTER — Other Ambulatory Visit: Payer: Self-pay

## 2014-12-05 ENCOUNTER — Ambulatory Visit: Payer: BC Managed Care – PPO | Admitting: Nurse Practitioner

## 2014-12-20 ENCOUNTER — Ambulatory Visit (INDEPENDENT_AMBULATORY_CARE_PROVIDER_SITE_OTHER): Payer: BLUE CROSS/BLUE SHIELD | Admitting: Nurse Practitioner

## 2014-12-20 ENCOUNTER — Encounter: Payer: Self-pay | Admitting: Nurse Practitioner

## 2014-12-20 VITALS — BP 112/80 | HR 80 | Resp 12 | Ht 60.0 in | Wt 157.0 lb

## 2014-12-20 DIAGNOSIS — Z803 Family history of malignant neoplasm of breast: Secondary | ICD-10-CM

## 2014-12-20 DIAGNOSIS — Z0000 Encounter for general adult medical examination without abnormal findings: Secondary | ICD-10-CM

## 2014-12-20 DIAGNOSIS — Z01419 Encounter for gynecological examination (general) (routine) without abnormal findings: Secondary | ICD-10-CM

## 2014-12-20 MED ORDER — VALACYCLOVIR HCL 1 G PO TABS: 1000.0000 mg | ORAL_TABLET | Freq: Every day | ORAL | Status: DC

## 2014-12-20 NOTE — Patient Instructions (Signed)

## 2014-12-20 NOTE — Progress Notes (Signed)
59 y.o. G28P0040 Married  Caucasian Fe here for annual exam.  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  She has felt well otherwise.  Husband is doing OK since his stroke.  Patient's last menstrual period was 01/28/2011 (approximate).          Sexually active: Yes.    The current method of family planning is post menopausal status.    Exercising: Yes.    Walking 3 - 4 x weekly Smoker:  no  Health Maintenance: Pap:  10/2011 Neg. HR HPV:neg MMG:  05/16/14 BIRADS2:benign  Colonoscopy:  08/14/14 Normal (history of polyp)- Repeat 5 years  TDaP:  10/29/2009 Labs: PCP   reports that she has quit smoking. She has never used smokeless tobacco. She reports that she drinks about 2.4 oz of alcohol per week. She reports that she does not use illicit drugs.  Past Medical History  Diagnosis Date  . Bulging disc     lower back   . HSV infection   . Depression     situational  . Thyroid disease   . Hypothyroidism   . Graves disease 9/96    hx of RAI    Past Surgical History  Procedure Laterality Date  . Laparoscopic appendectomy  10/03/2011    Procedure: APPENDECTOMY LAPAROSCOPIC;  Surgeon: Adin Hector, MD;  Location: [facility];  Service: General;  Laterality: N/A;  . Colonoscopy w/ biopsies  02/22/2009    tubular adenoma    Current Outpatient Prescriptions  Medication Sig Dispense Refill  . DULoxetine (CYMBALTA) 30 MG capsule Take 1 capsule by mouth daily.    . fluticasone (FLONASE) 50 MCG/ACT nasal spray Place 2 sprays into both nostrils daily as needed for allergies or rhinitis.    Marland Kitchen SYNTHROID 125 MCG tablet Take 1 tablet by mouth daily.    . traMADol (ULTRAM) 50 MG tablet Take 50 mg by mouth as needed. Pain    . valACYclovir (VALTREX) 1000 MG tablet Take 1 tablet (1,000 mg total) by mouth daily. 90 tablet 3   No current facility-administered medications for this visit.    Family History  Problem Relation Age of Onset  . Breast cancer Mother 12     breast - died age 86  . Hyperlipidemia Father   . Hyperlipidemia Sister   . Diabetes Sister   . Breast cancer Sister 34    Lumpectomy and Radiation  . Diabetes Brother   . Diabetes Sister     ROS:  Pertinent items are noted in HPI.  Otherwise, a comprehensive ROS was negative.  Exam:   BP 112/80 mmHg  Pulse 80  Resp 12  Ht 5' (1.524 m)  Wt 157 lb (71.215 kg)  BMI 30.66 kg/m2  LMP 01/28/2011 (Approximate) Height: 5' (152.4 cm) Ht Readings from Last 3 Encounters:  12/20/14 5' (1.524 m)  11/29/13 5' 0.5" (1.537 m)  11/16/12 5' 0.5" (1.537 m)    General appearance: alert, cooperative and appears stated age Head: Normocephalic, without obvious abnormality, atraumatic Neck: no adenopathy, supple, symmetrical, trachea midline and thyroid normal to inspection and palpation Lungs: clear to auscultation bilaterally Breasts: normal appearance, no masses or tenderness Heart: regular rate and rhythm Abdomen: soft, non-tender; no masses,  no organomegaly Extremities: extremities normal, atraumatic, no cyanosis or edema Skin: Skin color, texture, turgor normal. No rashes or lesions Lymph nodes: Cervical, supraclavicular, and axillary nodes normal. No abnormal inguinal nodes palpated Neurologic: Grossly normal   Pelvic: External genitalia:  no  lesions              Urethra:  normal appearing urethra with no masses, tenderness or lesions              Bartholin's and Skene's: normal                 Vagina: normal appearing vagina with normal color and discharge, no lesions              Cervix: anteverted              Pap taken: Yes.   Bimanual Exam:  Uterus:  normal size, contour, position, consistency, mobility, non-tender              Adnexa: no mass, fullness, tenderness               Rectovaginal: Confirms               Anus:  normal sphincter tone, no lesions  Chaperone present: yes  A:  Well Woman with normal exam  Postmenopausal no HRT Situational  depression Hypothyroid on replacement Atrophic vaginitis - off Estrace due to cost [facility]: mother with breast cancer age 13 and passed age 37.  Sister with diagnosis of breast cancer age 23 with negative BRCA (patient thinks)  P:   Reviewed health and wellness pertinent to exam  Pap smear as above  Mammogram is due 04/2015  She will contact sister and verify status of her BRCA - still it is recommended that pt. Be tested - she will consider then call back for a referral.  Will follow with lab  Refill of Valtrex for a year  Counseled on breast self exam, mammography screening, adequate intake of calcium and vitamin D, diet and exercise, Kegel's exercises return annually or prn  An After Visit Summary was printed and given to the patient.

## 2014-12-21 LAB — HEPATITIS C ANTIBODY: HCV AB: NEGATIVE

## 2014-12-25 ENCOUNTER — Encounter: Payer: Self-pay | Admitting: *Deleted

## 2014-12-25 NOTE — Progress Notes (Signed)
Encounter reviewed by Dr. Jevaughn Degollado Amundson C. Silva.  

## 2014-12-26 LAB — IPS PAP TEST WITH HPV

## 2015-06-26 ENCOUNTER — Telehealth: Payer: Self-pay | Admitting: Emergency Medicine

## 2015-06-26 NOTE — Telephone Encounter (Signed)
I am also routine this to Automatic DataPatty Dorsey.  Cc - Kimberly GoltzPatty Dorsey

## 2015-06-26 NOTE — Telephone Encounter (Signed)
Patient returned call. She declines to schedule Bilateral Breast MRI this year. She thinks she may want plan for next year but will discuss with Ria CommentPatricia Grubb, FNP.   Patient has annual exam with Ria CommentPatricia Grubb, FNP 01/01/16.   Routing to Dr. Edward JollySilva for review.  Okay to close?

## 2015-06-26 NOTE — Telephone Encounter (Signed)
MRI would need to be done within three months of the mammogram.  Ok to close encounter.

## 2015-06-26 NOTE — Telephone Encounter (Signed)
Chief Complaint  Patient presents with  . Advice Only    Breast MRI recommended on [facility]ogram dated 05/18/15  . Results   Calling to patient. She has Mammogram completed at [facility] 05/18/15. 3D mammogram. Breast density category D.  History of Breast Cancer in Mother at age 58 and Sister at age 867.   MRI recommended due to patient history and breast density. Greater than lifetime risk of 20% for breast cancer and MRI should be considered.   Message left to return call to [facility] at [phone] to discuss.

## 2015-06-26 NOTE — Telephone Encounter (Signed)
Noted message from Dr. Silva.  Will close encounter.   

## 2015-07-24 ENCOUNTER — Encounter: Payer: Self-pay | Admitting: Nurse Practitioner

## 2016-01-01 ENCOUNTER — Ambulatory Visit (INDEPENDENT_AMBULATORY_CARE_PROVIDER_SITE_OTHER): Payer: BLUE CROSS/BLUE SHIELD | Admitting: Nurse Practitioner

## 2016-01-01 ENCOUNTER — Encounter: Payer: Self-pay | Admitting: Nurse Practitioner

## 2016-01-01 VITALS — BP 120/76 | HR 80 | Ht 60.25 in | Wt 164.0 lb

## 2016-01-01 DIAGNOSIS — Z01419 Encounter for gynecological examination (general) (routine) without abnormal findings: Secondary | ICD-10-CM

## 2016-01-01 DIAGNOSIS — E559 Vitamin D deficiency, unspecified: Secondary | ICD-10-CM | POA: Diagnosis not present

## 2016-01-01 DIAGNOSIS — Z0000 Encounter for general adult medical examination without abnormal findings: Secondary | ICD-10-CM

## 2016-01-01 DIAGNOSIS — Z803 Family history of malignant neoplasm of breast: Secondary | ICD-10-CM

## 2016-01-01 NOTE — Progress Notes (Signed)
Patient ID: Kimberly Dorsey, female   DOB: 08/31/1955, 60 y.o.   MRN: [id number]  60 y.o. G21P0040 Married  Caucasian Fe here for annual exam. The only recent health problem is URI - she is currently on antibiotics and cough syrup.  She also had recent cholesterol was better on med's.    Last Vit D was low and on RX Vit D.  Patient's last menstrual period was 01/28/2011 (approximate).          Sexually active: Yes.    The current method of family planning is post menopausal status.    Exercising: No.  The patient does not participate in regular exercise at present. Smoker:  no  Health Maintenance: Pap: 12/20/14, Negative with neg HR HPV MMG:  05/18/15, 3D, Bi-Rads 2: Benign Findings Colonoscopy:  08/14/14 Normal (history of polyp)- Repeat 5 years  TDaP:  10/29/2009 Hep C: 12/20/14 HIV: 10/19/06 Labs: PCP takes care of all labs   reports that she has quit smoking. She has a 1.25 pack-year smoking history. She has never used smokeless tobacco. She reports that she drinks about 2.4 oz of alcohol per week . She reports that she does not use drugs.  Past Medical History:  Diagnosis Date  . Bulging disc    lower back   . Depression    situational  . Graves disease 9/96   hx of RAI  . HSV infection   . Hypothyroidism   . Thyroid disease     Past Surgical History:  Procedure Laterality Date  . COLONOSCOPY W/ BIOPSIES  02/22/2009   tubular adenoma  . LAPAROSCOPIC APPENDECTOMY  10/03/2011   Procedure: APPENDECTOMY LAPAROSCOPIC;  Surgeon: Adin Hector, MD;  Location: [facility];  Service: General;  Laterality: N/A;    Current Outpatient Prescriptions  Medication Sig Dispense Refill  . DULoxetine (CYMBALTA) 30 MG capsule Take 1 capsule by mouth daily.    . fluticasone (FLONASE) 50 MCG/ACT nasal spray Place 2 sprays into both nostrils daily as needed for allergies or rhinitis.    Marland Kitchen SYNTHROID 125 MCG tablet Take 1 tablet by mouth daily.    . traMADol (ULTRAM) 50 MG tablet Take 50 mg by mouth as  needed. Pain    . valACYclovir (VALTREX) 1000 MG tablet Take 1 tablet (1,000 mg total) by mouth daily. 90 tablet 3   No current facility-administered medications for this visit.     Family History  Problem Relation Age of Onset  . Breast cancer Mother 48    breast - died age 26  . Hyperlipidemia Father   . Hyperlipidemia Sister   . Diabetes Sister   . Breast cancer Sister 60    Lumpectomy and Radiation  . Diabetes Brother   . Diabetes Sister     ROS:  Pertinent items are noted in HPI.  Otherwise, a comprehensive ROS was negative.  Exam:   LMP 01/28/2011 (Approximate) Comment: postmenopausal   Ht Readings from Last 3 Encounters:  12/20/14 5' (1.524 m)  11/29/13 5' 0.5" (1.537 m)  11/16/12 5' 0.5" (1.537 m)    General appearance: alert, cooperative and appears stated age Head: Normocephalic, without obvious abnormality, atraumatic Neck: no adenopathy, supple, symmetrical, trachea midline and thyroid normal to inspection and palpation Lungs: clear to auscultation bilaterally Breasts: normal appearance, no masses or tenderness Heart: regular rate and rhythm Abdomen: soft, non-tender; no masses,  no organomegaly Extremities: extremities normal, atraumatic, no cyanosis or edema Skin: Skin color, texture, turgor normal. No rashes or lesions Lymph  nodes: Cervical, supraclavicular, and axillary nodes normal. No abnormal inguinal nodes palpated Neurologic: Grossly normal   Pelvic: External genitalia:  no lesions              Urethra:  normal appearing urethra with no masses, tenderness or lesions              Bartholin's and Skene's: normal                 Vagina: normal appearing vagina with normal color and discharge, no lesions              Cervix: anteverted              Pap taken: No. Bimanual Exam:  Uterus:  normal size, contour, position, consistency, mobility, non-tender              Adnexa: no mass, fullness, tenderness               Rectovaginal: Confirms                Anus:  normal sphincter tone, no lesions  Chaperone present: yes  A:  Well Woman with normal exam  Postmenopausal no HRT Situational depression Hypothyroid on replacement Atrophic vaginitis - off Estrace due to cost [facility]: mother with breast cancer age 66 and passed age 61.  Sister with diagnosis of breast cancer age 80 with negative BRCA (patient thinks).  Declines BRCA testing  URI - on med's  Vit D deficiency  hypercholesterolemia    P:   Reviewed health and wellness pertinent to exam  Pap smear not done  Mammogram is due in April and she has now consented to doing MRI - order is placed  Counseled on breast self exam, mammography screening, adequate intake of calcium and vitamin D, diet and exercise, Kegel's exercises return annually or prn  An After Visit Summary was printed and given to the patient.  Labs from PCP - Vit D was 22.7 - now on RX Lipid panel:  Total chol 251; Trig: 148; HDL: 54; LDL: 197 Rest of labs are sent to scan

## 2016-01-01 NOTE — Patient Instructions (Signed)

## 2016-01-04 NOTE — Progress Notes (Signed)
Encounter reviewed by Dr. Brook Amundson C. Silva.  

## 2016-02-11 DIAGNOSIS — J069 Acute upper respiratory infection, unspecified: Secondary | ICD-10-CM | POA: Diagnosis not present

## 2016-02-11 DIAGNOSIS — R05 Cough: Secondary | ICD-10-CM | POA: Diagnosis not present

## 2016-02-11 DIAGNOSIS — J209 Acute bronchitis, unspecified: Secondary | ICD-10-CM | POA: Diagnosis not present

## 2016-02-11 DIAGNOSIS — J029 Acute pharyngitis, unspecified: Secondary | ICD-10-CM | POA: Diagnosis not present

## 2016-03-17 DIAGNOSIS — F329 Major depressive disorder, single episode, unspecified: Secondary | ICD-10-CM | POA: Diagnosis not present

## 2016-03-17 DIAGNOSIS — E038 Other specified hypothyroidism: Secondary | ICD-10-CM | POA: Diagnosis not present

## 2016-03-17 DIAGNOSIS — R5383 Other fatigue: Secondary | ICD-10-CM | POA: Diagnosis not present

## 2016-03-17 DIAGNOSIS — D126 Benign neoplasm of colon, unspecified: Secondary | ICD-10-CM | POA: Diagnosis not present

## 2016-03-17 DIAGNOSIS — E784 Other hyperlipidemia: Secondary | ICD-10-CM | POA: Diagnosis not present

## 2016-04-16 DIAGNOSIS — Z6831 Body mass index (BMI) 31.0-31.9, adult: Secondary | ICD-10-CM | POA: Diagnosis not present

## 2016-04-16 DIAGNOSIS — R0789 Other chest pain: Secondary | ICD-10-CM | POA: Diagnosis not present

## 2016-04-16 DIAGNOSIS — E784 Other hyperlipidemia: Secondary | ICD-10-CM | POA: Diagnosis not present

## 2016-05-21 DIAGNOSIS — Z803 Family history of malignant neoplasm of breast: Secondary | ICD-10-CM | POA: Diagnosis not present

## 2016-05-21 DIAGNOSIS — Z1231 Encounter for screening mammogram for malignant neoplasm of breast: Secondary | ICD-10-CM | POA: Diagnosis not present

## 2016-05-29 ENCOUNTER — Encounter: Payer: Self-pay | Admitting: Nurse Practitioner

## 2016-05-29 ENCOUNTER — Telehealth: Payer: Self-pay | Admitting: Nurse Practitioner

## 2016-05-29 NOTE — Telephone Encounter (Signed)
Kimberly Dorsey, with [facility], called regarding scheduling a Breast MRI. Per Kimberly ChaseFabi, she spoke with the patient who advised her mammogram screening was completed on 05/21/16 with [facility] and the imaging report does not recommend an MRI.  [facility] is asking how to proceed with the MRI order.  Advised Kimberly Dorsey I will forward this to the provider to advise.  Routing to Walt DisneyPatricia Dorsey  cc: Kimberly AlanisKaitlyn Dorsey

## 2016-05-30 NOTE — Telephone Encounter (Signed)
Pt has a greater than 20% chance of breast cancer given history of mother and sister with breast cancer.  Previous Mammogram 04/2015 stated she should get breast MRI.  We agreed but pt was not willing at that time.  At her [facility] 12/2015 this was discussed and she then agreed.  She had to wait and get MRI within 3 months of Mammogram - which is now.  We strongly advise pt to get this done.

## 2016-06-04 NOTE — Telephone Encounter (Signed)
Spoke with patient. Advised of message as seen below from Ria CommentPatricia Grubb, FNP. Patient verbalizes understanding. Patient is agreeable to proceeding with breast MRI at this time. Aware this will be preauthorized with her insurance company and she will be contacted by [facility] to schedule.   Routing to [employer]zy Dixon for preauthorization before scheduling.

## 2016-06-06 NOTE — Telephone Encounter (Signed)
Please keep in Mammo hold until results of MRI are back.

## 2016-06-06 NOTE — Telephone Encounter (Signed)
Authorization has been obtained from [employer].  The authorization number is [id number], which is valid 06/06/16 through 07/05/16.  I have notified Fabi, with [facility] of this information.  Fabi will contact the patient and proceed with scheduling.  Routing to Ria CommentPatricia Grubb, FNP  cc: Nolen MuKaitlyn Sprague

## 2016-06-09 ENCOUNTER — Other Ambulatory Visit: Payer: Self-pay | Admitting: Endocrinology

## 2016-06-09 DIAGNOSIS — R05 Cough: Secondary | ICD-10-CM | POA: Diagnosis not present

## 2016-06-09 DIAGNOSIS — J189 Pneumonia, unspecified organism: Secondary | ICD-10-CM | POA: Diagnosis not present

## 2016-06-09 DIAGNOSIS — R1013 Epigastric pain: Secondary | ICD-10-CM | POA: Diagnosis not present

## 2016-06-09 DIAGNOSIS — R0789 Other chest pain: Secondary | ICD-10-CM | POA: Diagnosis not present

## 2016-06-09 DIAGNOSIS — R9389 Abnormal findings on diagnostic imaging of other specified body structures: Secondary | ICD-10-CM

## 2016-06-10 ENCOUNTER — Ambulatory Visit
Admission: RE | Admit: 2016-06-10 | Discharge: 2016-06-10 | Disposition: A | Discharge status: Home / Self Care | Payer: BLUE CROSS/BLUE SHIELD | Source: Ambulatory Visit | Attending: Endocrinology | Admitting: Endocrinology

## 2016-06-10 DIAGNOSIS — R9389 Abnormal findings on diagnostic imaging of other specified body structures: Secondary | ICD-10-CM

## 2016-06-10 DIAGNOSIS — R0789 Other chest pain: Secondary | ICD-10-CM | POA: Diagnosis not present

## 2016-06-10 MED ORDER — IOPAMIDOL (ISOVUE-300) INJECTION 61%
75.0000 mL | Freq: Once | INTRAVENOUS | Status: AC | PRN
  Administered 2016-06-10: 75 mL via INTRAVENOUS

## 2016-06-26 NOTE — Telephone Encounter (Signed)
Patient placed in imaging hold.  Routing to provider for final review. Patient agreeable to disposition. Will close encounter.  

## 2016-06-26 NOTE — Telephone Encounter (Signed)
Appointment for Breast MRI has been scheduled with [facility] on 06/30/16.  Routing to Ria CommentPatricia Grubb, FNP  cc Yvonna AlanisKaitlyn Sprague

## 2016-06-30 ENCOUNTER — Ambulatory Visit
Admission: RE | Admit: 2016-06-30 | Discharge: 2016-06-30 | Disposition: A | Discharge status: Home / Self Care | Payer: BLUE CROSS/BLUE SHIELD | Source: Ambulatory Visit | Attending: Nurse Practitioner | Admitting: Nurse Practitioner

## 2016-06-30 DIAGNOSIS — Z803 Family history of malignant neoplasm of breast: Secondary | ICD-10-CM

## 2016-06-30 DIAGNOSIS — N6011 Diffuse cystic mastopathy of right breast: Secondary | ICD-10-CM | POA: Diagnosis not present

## 2016-06-30 DIAGNOSIS — N6012 Diffuse cystic mastopathy of left breast: Secondary | ICD-10-CM | POA: Diagnosis not present

## 2016-06-30 MED ORDER — GADOBENATE DIMEGLUMINE 529 MG/ML IV SOLN
15.0000 mL | Freq: Once | INTRAVENOUS | Status: AC | PRN
  Administered 2016-06-30: 15 mL via INTRAVENOUS

## 2016-07-08 ENCOUNTER — Telehealth: Payer: Self-pay

## 2016-07-08 NOTE — Telephone Encounter (Signed)
Spoke with patient. Advised of message as seen below from Dr.Miller. Patient is agreeable and verbalizes understanding.  Routing to provider for final review. Patient agreeable to disposition. Will close encounter   

## 2016-07-08 NOTE — Telephone Encounter (Signed)
Jerene BearsMiller, Mary S, MD  Kitti Mcclish, Caroleen HammanKaitlyn E, RN  Cc: Ria CommentGrubb, Patricia, FNP; Armen PickupYeakley, Sarah S, RN        Kimberly AlanisKaitlyn,  It is ok to notify pt that her MRI is negative and that she has several small scattered cysts in both breasts.   Due to family hx, I did a calculation of lifetime breast cancer risk (tyrer-cuzick) model. She has a lifetime risks of 36.4% of breast cancer. A woman is considered high risk if this calculation is >20%. As hers is, she will need yearly MRI.    No recall needed.    Left message to call Casimer Russett at [phone].

## 2016-08-19 ENCOUNTER — Telehealth: Payer: Self-pay | Admitting: Obstetrics & Gynecology

## 2016-08-19 NOTE — Telephone Encounter (Signed)
LMTCB/:NP/ .CX/LETTER SENT/RD ° °

## 2016-09-15 DIAGNOSIS — E559 Vitamin D deficiency, unspecified: Secondary | ICD-10-CM | POA: Diagnosis not present

## 2016-09-15 DIAGNOSIS — E038 Other specified hypothyroidism: Secondary | ICD-10-CM | POA: Diagnosis not present

## 2016-09-15 DIAGNOSIS — E784 Other hyperlipidemia: Secondary | ICD-10-CM | POA: Diagnosis not present

## 2016-09-15 DIAGNOSIS — Z1389 Encounter for screening for other disorder: Secondary | ICD-10-CM | POA: Diagnosis not present

## 2016-09-15 DIAGNOSIS — F3289 Other specified depressive episodes: Secondary | ICD-10-CM | POA: Diagnosis not present

## 2016-10-09 DIAGNOSIS — M25571 Pain in right ankle and joints of right foot: Secondary | ICD-10-CM | POA: Diagnosis not present

## 2016-10-09 DIAGNOSIS — R110 Nausea: Secondary | ICD-10-CM | POA: Diagnosis not present

## 2016-10-13 DIAGNOSIS — M25571 Pain in right ankle and joints of right foot: Secondary | ICD-10-CM | POA: Diagnosis not present

## 2016-10-16 DIAGNOSIS — H524 Presbyopia: Secondary | ICD-10-CM | POA: Diagnosis not present

## 2016-10-16 DIAGNOSIS — H5203 Hypermetropia, bilateral: Secondary | ICD-10-CM | POA: Diagnosis not present

## 2016-10-16 DIAGNOSIS — H52203 Unspecified astigmatism, bilateral: Secondary | ICD-10-CM | POA: Diagnosis not present

## 2016-10-24 ENCOUNTER — Ambulatory Visit (INDEPENDENT_AMBULATORY_CARE_PROVIDER_SITE_OTHER): Payer: BLUE CROSS/BLUE SHIELD

## 2016-10-24 ENCOUNTER — Ambulatory Visit (INDEPENDENT_AMBULATORY_CARE_PROVIDER_SITE_OTHER): Payer: BLUE CROSS/BLUE SHIELD | Admitting: Family

## 2016-10-24 ENCOUNTER — Encounter (INDEPENDENT_AMBULATORY_CARE_PROVIDER_SITE_OTHER): Payer: Self-pay | Admitting: Family

## 2016-10-24 DIAGNOSIS — M25571 Pain in right ankle and joints of right foot: Secondary | ICD-10-CM

## 2016-10-24 NOTE — Progress Notes (Addendum)
Office Visit Note   Patient: Kimberly Dorsey           Date of Birth: October 16, 1955           MRN: [id number] Visit Date: 10/24/2016              Requested by: Adrian Prince, MD [address] [place], [place] [address] PCP: Adrian Prince, MD  Chief Complaint  Patient presents with  . Right Ankle - Pain      HPI: The patient is a 61 year old woman who presents today complaining of two-week history of right lateral ankle pain. The initial onset of pain was 2 weeks ago when she awoke in the morning there is no associated injury awoke with swelling and pain and difficulty ambulating.   She's been having swelling and erythema over her lateral malleolus this is exquisitely tender there is intermittent warmth associated with this she has increased pain with weightbearing. Initially seen at the urgent care at the current nodal clinic and was placed on a Medrol Dosepak. States while taking this her pain did improve. Following the end completion of the prednisone and her pain returned. She states that she was next placed in a fracture boot did get another Medrol Dosepak as well as a Keflex prescription for possible bug bite. Has not seen any improvement with the second prednisone or Keflex she took her last dose of Keflex today.    Assessment & Plan: Visit Diagnoses:  1. Pain in right ankle and joints of right foot     Plan:  Impression acute gout v peroneal tendonitis We will draw uric acid as well as CBC today. Provided a sample of colchicine she'll take 2 today and 1 tomorrow we'll call her with the results of her lab work may move on to an MRI.  Follow-Up Instructions: Return in about 1 week (around 10/31/2016).   Ortho Exam  Patient is alert, oriented, no adenopathy, well-dressed, normal affect, normal respiratory effort. Right ankle ligaments are nontender. The medial malleolus is nontender. Has swelling and slight erythema over the lateral malleolus tender along the course of the  peroneal tendons. States she has pain with inversion and eversion of the ankle. Achilles is nontender no pain with dorsiflexion or plantarflexion.  Imaging: Xr Ankle Complete Right  Result Date: 10/24/2016 Radiographs of the right ankle are negative for fracture. No acute finding. Does have posterior calcaneal spur.  No images are attached to the encounter.  Labs: No results found for: HGBA1C, ESRSEDRATE, CRP, LABURIC, REPTSTATUS, GRAMSTAIN, CULT, LABORGA  Orders:  Orders Placed This Encounter  Procedures  . XR Ankle Complete Right  . CBC with Differential  . Uric acid   No orders of the defined types were placed in this encounter.    Procedures: No procedures performed  Clinical Data: No additional findings.  ROS:  All other systems negative, except as noted in the HPI. Review of Systems  Constitutional: Negative for chills and fever.  Musculoskeletal: Positive for arthralgias and joint swelling.    Objective: Vital Signs: Ht 5' (1.524 m)   Wt 158 lb (71.7 kg)   LMP 01/28/2011 (Approximate) Comment: postmenopausal  BMI 30.86 kg/m   Specialty Comments:  No specialty comments available.  PMFS History: Patient Active Problem List   Diagnosis Date Noted  . Acquired hypothyroidism 01/09/2014  . LBP (low back pain) 01/09/2014  . Pure hypercholesterolemia 01/09/2014  . Atrophic vaginitis 11/16/2012  . Unspecified hypothyroidism 11/16/2012  . 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 Bulging disc    lower back   . Depression    situational  . Graves disease 9/96   hx of RAI  . HSV infection   . Hypothyroidism   . Thyroid disease     Family History  Problem Relation Age of Onset  . Breast cancer Mother 55       breast - died age 49  . Hyperlipidemia Father   . Hyperlipidemia Sister   . Diabetes Sister   . Breast cancer Sister 75       Lumpectomy and Radiation  . Diabetes Brother   . Diabetes Sister     Past Surgical  History:  Procedure Laterality Date  . COLONOSCOPY W/ BIOPSIES  02/22/2009   tubular adenoma  . LAPAROSCOPIC APPENDECTOMY  10/03/2011   Procedure: APPENDECTOMY LAPAROSCOPIC;  Surgeon: Ernestene Mention, MD;  Location: [facility];  Service: General;  Laterality: N/A;   Social History   Occupational History  .  [employer]   Social History Main Topics  . Smoking status: Former Smoker    Packs/day: 0.25    Years: 5.00  . Smokeless tobacco: Never Used  . Alcohol use 2.4 oz/week    4 Glasses of wine per week     Comment: couple per week  . Drug use: No  . Sexual activity: Yes    Partners: Male    Birth control/ protection: Post-menopausal

## 2016-10-25 LAB — CBC WITH DIFFERENTIAL/PLATELET
BASOS PCT: 0.4 %
Basophils Absolute: 74 cells/uL (ref 0–200)
EOS PCT: 0.9 %
Eosinophils Absolute: 166 cells/uL (ref 15–500)
HCT: 44.3 % (ref 35.0–45.0)
Hemoglobin: 14.8 g/dL (ref 11.7–15.5)
Lymphs Abs: 5943 cells/uL — ABNORMAL HIGH (ref 850–3900)
MCH: 30.2 pg (ref 27.0–33.0)
MCHC: 33.4 g/dL (ref 32.0–36.0)
MCV: 90.4 fL (ref 80.0–100.0)
MONOS PCT: 7.5 %
MPV: 10 fL (ref 7.5–12.5)
Neutro Abs: 10838 cells/uL — ABNORMAL HIGH (ref 1500–7800)
Neutrophils Relative %: 58.9 %
Platelets: 378 10*3/uL (ref 140–400)
RBC: 4.9 10*6/uL (ref 3.80–5.10)
RDW: 11.8 % (ref 11.0–15.0)
TOTAL LYMPHOCYTE: 32.3 %
WBC: 18.4 10*3/uL — AB (ref 3.8–10.8)
WBCMIX: 1380 {cells}/uL — AB (ref 200–950)

## 2016-10-25 LAB — URIC ACID: Uric Acid, Serum: 6.3 mg/dL (ref 2.5–7.0)

## 2016-10-27 ENCOUNTER — Other Ambulatory Visit (INDEPENDENT_AMBULATORY_CARE_PROVIDER_SITE_OTHER): Payer: Self-pay | Admitting: Family

## 2016-10-27 DIAGNOSIS — M25571 Pain in right ankle and joints of right foot: Secondary | ICD-10-CM

## 2016-10-31 ENCOUNTER — Ambulatory Visit
Admission: RE | Admit: 2016-10-31 | Discharge: 2016-10-31 | Disposition: A | Discharge status: Home / Self Care | Payer: BLUE CROSS/BLUE SHIELD | Source: Ambulatory Visit | Attending: Family | Admitting: Family

## 2016-10-31 ENCOUNTER — Telehealth (INDEPENDENT_AMBULATORY_CARE_PROVIDER_SITE_OTHER): Payer: Self-pay | Admitting: Orthopedic Surgery

## 2016-10-31 DIAGNOSIS — M7989 Other specified soft tissue disorders: Secondary | ICD-10-CM | POA: Diagnosis not present

## 2016-10-31 DIAGNOSIS — M25571 Pain in right ankle and joints of right foot: Secondary | ICD-10-CM

## 2016-10-31 NOTE — Telephone Encounter (Signed)
Vella Redhead called on Pt behalf to change appointment   MRI will be done  tonight

## 2016-11-02 ENCOUNTER — Inpatient Hospital Stay: Admission: RE | Admit: 2016-11-02 | Payer: BLUE CROSS/BLUE SHIELD | Source: Ambulatory Visit

## 2016-11-03 ENCOUNTER — Encounter (INDEPENDENT_AMBULATORY_CARE_PROVIDER_SITE_OTHER): Payer: Self-pay | Admitting: Orthopedic Surgery

## 2016-11-03 ENCOUNTER — Ambulatory Visit (INDEPENDENT_AMBULATORY_CARE_PROVIDER_SITE_OTHER): Payer: BLUE CROSS/BLUE SHIELD | Admitting: Orthopedic Surgery

## 2016-11-03 DIAGNOSIS — M25571 Pain in right ankle and joints of right foot: Secondary | ICD-10-CM | POA: Diagnosis not present

## 2016-11-03 DIAGNOSIS — M7671 Peroneal tendinitis, right leg: Secondary | ICD-10-CM

## 2016-11-03 NOTE — Progress Notes (Signed)
Office Visit Note   Patient: Kimberly Dorsey           Date of Birth: 09-18-1955           MRN: [id number] Visit Date: 11/03/2016              Requested by: Adrian Prince, MD [address] [place], [place] [address] PCP: Adrian Prince, MD  Chief Complaint  Patient presents with  . Right Ankle - Follow-up      HPI: Patient is a 61 year old woman who presents with a three-week history of pain in the peroneal tendons right ankle. Patient has been treated with antibiotics including Keflex anti-inflammatories including prednisone and wore a fracture boot for 2 days.  Patient is seen in referral from [facility]. Patient denies any history of gout. She is status post an MRI scan.  Her uric acid was 6.3. Her white cell count was 18.4.  Assessment & Plan: Visit Diagnoses:  1. Pain in right ankle and joints of right foot   2. Peroneus brevis tendinitis, right     Plan: This is a challenging presentation. Since she has had no advancement of the redness and swelling it is unlikely that this is infectious process. The white cell count was obtained after the prednisone was started as elevated white cell count may be demargination from the prednisone The fact that she had no relief with prednisone makes it less likely to be associated with gout and acute pain from her peroneus brevis tear is also unlikely without acute trauma. We will have her use ibuprofen 600 mg 3 times a day where the fracture boot for 3 weeks.  Repeat uric acid repeat white blood cell count sedimentation rate and C-reactive protein at follow-up.  Follow-Up Instructions: Return in about 3 weeks (around 11/24/2016).   Ortho Exam  Patient is alert, oriented, no adenopathy, well-dressed, normal affect, normal respiratory effort. On examination patient has an antalgic gait she has a good dorsalis pedis pulse. She does have swelling laterally over the ankle there is a very small discoloration to the skin which is about 2 mm in  diameter there is no cellulitis but there is swelling and tenderness to palpation over the peroneal tendons. She has a good dorsalis pedis pulse good ankle good subtalar motion. Review of the MRI scan shows peroneal brevis tear as well as inflammation of the soft tissue possibly consistent with cellulitis.  Imaging: No results found. No images are attached to the encounter.  Labs: Lab Results  Component Value Date   LABURIC 6.3 10/24/2016    Orders:  No orders of the defined types were placed in this encounter.  No orders of the defined types were placed in this encounter.    Procedures: No procedures performed  Clinical Data: No additional findings.  ROS:  All other systems negative, except as noted in the HPI. Review of Systems  Objective: Vital Signs: LMP 01/28/2011 (Approximate) Comment: postmenopausal  Specialty Comments:  No specialty comments available.  PMFS History: Patient Active Problem List   Diagnosis Date Noted  . Peroneus brevis tendinitis, right 11/03/2016  . Acquired hypothyroidism 01/09/2014  . LBP (low back pain) 01/09/2014  . Pure hypercholesterolemia 01/09/2014  . Atrophic vaginitis 11/16/2012  . Unspecified hypothyroidism 11/16/2012  . 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 Bulging disc    lower back   . Depression    situational  . Graves disease 9/96   hx of  RAI  . HSV infection   . Hypothyroidism   . Thyroid disease     Family History  Problem Relation Age of Onset  . Breast cancer Mother 53       breast - died age 38  . Hyperlipidemia Father   . Hyperlipidemia Sister   . Diabetes Sister   . Breast cancer Sister 42       Lumpectomy and Radiation  . Diabetes Brother   . Diabetes Sister     Past Surgical History:  Procedure Laterality Date  . COLONOSCOPY W/ BIOPSIES  02/22/2009   tubular adenoma  . LAPAROSCOPIC APPENDECTOMY  10/03/2011   Procedure: APPENDECTOMY LAPAROSCOPIC;  Surgeon:  Ernestene Mention, MD;  Location: [facility];  Service: General;  Laterality: N/A;   Social History   Occupational History  .  [employer]   Social History Main Topics  . Smoking status: Former Smoker    Packs/day: 0.25    Years: 5.00  . Smokeless tobacco: Never Used  . Alcohol use 2.4 oz/week    4 Glasses of wine per week     Comment: couple per week  . Drug use: No  . Sexual activity: Yes    Partners: Male    Birth control/ protection: Post-menopausal

## 2016-11-05 ENCOUNTER — Ambulatory Visit (INDEPENDENT_AMBULATORY_CARE_PROVIDER_SITE_OTHER): Payer: BLUE CROSS/BLUE SHIELD | Admitting: Orthopedic Surgery

## 2016-11-24 ENCOUNTER — Ambulatory Visit (INDEPENDENT_AMBULATORY_CARE_PROVIDER_SITE_OTHER): Payer: BLUE CROSS/BLUE SHIELD | Admitting: Orthopedic Surgery

## 2016-11-24 ENCOUNTER — Encounter (INDEPENDENT_AMBULATORY_CARE_PROVIDER_SITE_OTHER): Payer: Self-pay | Admitting: Orthopedic Surgery

## 2016-11-24 DIAGNOSIS — M25571 Pain in right ankle and joints of right foot: Secondary | ICD-10-CM

## 2016-11-24 DIAGNOSIS — M7671 Peroneal tendinitis, right leg: Secondary | ICD-10-CM

## 2016-11-24 NOTE — Progress Notes (Signed)
Office Visit Note   Patient: Kimberly Dorsey           Date of Birth: 04/09/1955           MRN: [id number] Visit Date: 11/24/2016              Requested by: Adrian PrinceSouth, Stephen, MD [address] [place], [place] [id number] PCP: Adrian PrinceSouth, Stephen, MD  Chief Complaint  Patient presents with  . Right Foot - Follow-up, Pain      HPI: Patient is a 61 year old woman who presents in follow-up for perineal tendinitis with partial tearing of the peroneus brevis on the right.  She is wearing a short fracture boot using anti-inflammatory and states that her symptoms are better.  Assessment & Plan: Visit Diagnoses:  1. Peroneus brevis tendinitis, right   2. Pain in right ankle and joints of right foot     Plan: We will redraw her lab work including uric acid CBC and sed rate.  Recommended knee-high compression stockings continue with the fracture boot discussed surgical versus nonsurgical intervention.  Patient will like to continue with nonoperative intervention and will reevaluate in 6 weeks.  We will call if there is anything abnormal with her lab work.  Follow-Up Instructions: Return in about 6 weeks (around 01/05/2017).   Ortho Exam  Patient is alert, oriented, no adenopathy, well-dressed, normal affect, normal respiratory effort. Good dorsalis pedis pulse she does have some venous stasis swelling with pitting edema along the tibial tubercle.  She does have a small varicose vein over the lateral malleolus of the right ankle.  She has some swelling of the peroneal tendons and these are tender to palpation.  She can walk on her heels but has pain over the peroneal tendons when walking on her toes.  Imaging: No results found. No images are attached to the encounter.  Labs: Lab Results  Component Value Date   LABURIC 6.3 10/24/2016    Orders:  No orders of the defined types were placed in this encounter.  No orders of the defined types were placed in this encounter.    Procedures: No  procedures performed  Clinical Data: No additional findings.  ROS:  All other systems negative, except as noted in the HPI. Review of Systems  Objective: Vital Signs: LMP 01/28/2011 (Approximate) Comment: postmenopausal  Specialty Comments:  No specialty comments available.  PMFS History: Patient Active Problem List   Diagnosis Date Noted  . Peroneus brevis tendinitis, right 11/03/2016  . Acquired hypothyroidism 01/09/2014  . LBP (low back pain) 01/09/2014  . Pure hypercholesterolemia 01/09/2014  . Atrophic vaginitis 11/16/2012  . Unspecified hypothyroidism 11/16/2012  . 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 Bulging disc    lower back   . Depression    situational  . Graves disease 9/96   hx of RAI  . HSV infection   . Hypothyroidism   . Thyroid disease     Family History  Problem Relation Age of Onset  . Breast cancer Mother [id number]       breast - died age 59  . Hyperlipidemia Father   . Hyperlipidemia Sister   . Diabetes Sister   . Breast cancer Sister [id number]       Lumpectomy and Radiation  . Diabetes Brother   . Diabetes Sister     Past Surgical History:  Procedure Laterality Date  . COLONOSCOPY W/ BIOPSIES  02/22/2009   tubular adenoma  . LAPAROSCOPIC APPENDECTOMY  10/03/2011   Procedure: APPENDECTOMY LAPAROSCOPIC;  Surgeon: Ernestene Mention, MD;  Location: [facility];  Service: General;  Laterality: N/A;   Social History   Occupational History  .  [employer]   Social History Main Topics  . Smoking status: Former Smoker    Packs/day: 0.25    Years: 5.00  . Smokeless tobacco: Never Used  . Alcohol use 2.4 oz/week    4 Glasses of wine per week     Comment: couple per week  . Drug use: No  . Sexual activity: Yes    Partners: Male    Birth control/ protection: Post-menopausal

## 2016-11-25 ENCOUNTER — Telehealth (INDEPENDENT_AMBULATORY_CARE_PROVIDER_SITE_OTHER): Payer: Self-pay | Admitting: Radiology

## 2016-11-25 LAB — CBC WITH DIFFERENTIAL/PLATELET
BASOS ABS: 64 {cells}/uL (ref 0–200)
Basophils Relative: 0.7 %
EOS ABS: 127 {cells}/uL (ref 15–500)
Eosinophils Relative: 1.4 %
HEMATOCRIT: 41.3 % (ref 35.0–45.0)
Hemoglobin: 14.1 g/dL (ref 11.7–15.5)
LYMPHS ABS: 3686 {cells}/uL (ref 850–3900)
MCH: 30.3 pg (ref 27.0–33.0)
MCHC: 34.1 g/dL (ref 32.0–36.0)
MCV: 88.8 fL (ref 80.0–100.0)
MPV: 10.3 fL (ref 7.5–12.5)
Monocytes Relative: 8.3 %
NEUTROS ABS: 4468 {cells}/uL (ref 1500–7800)
NEUTROS PCT: 49.1 %
Platelets: 331 10*3/uL (ref 140–400)
RBC: 4.65 10*6/uL (ref 3.80–5.10)
RDW: 12 % (ref 11.0–15.0)
Total Lymphocyte: 40.5 %
WBC: 9.1 10*3/uL (ref 3.8–10.8)
WBCMIX: 755 {cells}/uL (ref 200–950)

## 2016-11-25 LAB — SEDIMENTATION RATE: Sed Rate: 6 mm/h (ref 0–30)

## 2016-11-25 LAB — C-REACTIVE PROTEIN: CRP: 2.7 mg/L (ref <8.0)

## 2016-11-25 LAB — URIC ACID: Uric Acid, Serum: 6.1 mg/dL (ref 2.5–7.0)

## 2016-11-25 NOTE — Telephone Encounter (Signed)
I called and spoke with patient over the phone to advise all labs within normal limits. Continue with boot and we will see her at follow up visit.

## 2016-11-25 NOTE — Telephone Encounter (Signed)
-----   Message from Nadara MustardMarcus Duda V, MD sent at 11/25/2016  7:13 AM EDT ----- Call patient all of her laboratory values are within normal range.

## 2016-12-25 DIAGNOSIS — J208 Acute bronchitis due to other specified organisms: Secondary | ICD-10-CM | POA: Diagnosis not present

## 2017-01-02 ENCOUNTER — Ambulatory Visit: Payer: BLUE CROSS/BLUE SHIELD | Admitting: Nurse Practitioner

## 2017-01-05 ENCOUNTER — Ambulatory Visit (INDEPENDENT_AMBULATORY_CARE_PROVIDER_SITE_OTHER): Payer: BLUE CROSS/BLUE SHIELD | Admitting: Orthopedic Surgery

## 2017-01-06 ENCOUNTER — Other Ambulatory Visit: Payer: Self-pay

## 2017-01-06 ENCOUNTER — Other Ambulatory Visit (HOSPITAL_COMMUNITY)
Admission: RE | Admit: 2017-01-06 | Discharge: 2017-01-06 | Disposition: A | Discharge status: Home / Self Care | Payer: BLUE CROSS/BLUE SHIELD | Source: Ambulatory Visit | Attending: Obstetrics & Gynecology | Admitting: Obstetrics & Gynecology

## 2017-01-06 ENCOUNTER — Encounter: Payer: Self-pay | Admitting: Certified Nurse Midwife

## 2017-01-06 ENCOUNTER — Ambulatory Visit (INDEPENDENT_AMBULATORY_CARE_PROVIDER_SITE_OTHER): Payer: BLUE CROSS/BLUE SHIELD | Admitting: Certified Nurse Midwife

## 2017-01-06 VITALS — BP 114/76 | HR 70 | Resp 16 | Ht 60.0 in | Wt 161.0 lb

## 2017-01-06 DIAGNOSIS — Z803 Family history of malignant neoplasm of breast: Secondary | ICD-10-CM

## 2017-01-06 DIAGNOSIS — Z124 Encounter for screening for malignant neoplasm of cervix: Secondary | ICD-10-CM | POA: Diagnosis not present

## 2017-01-06 DIAGNOSIS — A6009 Herpesviral infection of other urogenital tract: Secondary | ICD-10-CM | POA: Diagnosis not present

## 2017-01-06 DIAGNOSIS — Z8049 Family history of malignant neoplasm of other genital organs: Secondary | ICD-10-CM

## 2017-01-06 DIAGNOSIS — Z01419 Encounter for gynecological examination (general) (routine) without abnormal findings: Secondary | ICD-10-CM | POA: Diagnosis not present

## 2017-01-06 MED ORDER — VALACYCLOVIR HCL 1 G PO TABS: 1000.0000 mg | ORAL_TABLET | Freq: Every day | ORAL | 3 refills | Status: DC

## 2017-01-06 NOTE — Progress Notes (Signed)
61 y.o. [id number] Married  Caucasian Fe here for annual exam. Menopausal no HRT, denies vaginal bleeding. Some vaginal dryness and using coconut oil with good results. Recently treated for Bronchitis with PCP Dr. Evlyn KannerSouth, also sees him for aex/labs and anxiety/hypothyroidism management.  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 Needs update on RX. Sister diagnosed with cervical cancer(age 59) would like to have pap smear today. Very busy at work otherwise good year. No other health issues today..  Patient's last menstrual period was 01/28/2011 (approximate).          Sexually active: Yes.    The current method of family planning is post menopausal status.    Exercising: No.  exercise Smoker:  no  Health Maintenance: Pap:  12-20-14 neg HPV HR neg History of Abnormal Pap: no MMG:  06-30-16 breast mri category c density birads 2:neg Self Breast exams: yes Colonoscopy:  2016 f/u 2018yrs  BMD:   none TDaP:  2011 Shingles: no Pneumonia: no Hep C and HIV: HIV neg 2008, hep c neg 2016 Labs: with PCP   reports that she has quit smoking. She has a 1.25 pack-year smoking history. she has never used smokeless tobacco. She reports that she drinks about 3.0 oz of alcohol per week. She reports that she does not use drugs.  Past Medical History:  Diagnosis Date  . Bulging disc    lower back   . Depression    situational  . Graves disease 9/96   hx of RAI  . HSV infection   . Hypothyroidism   . Thyroid disease     Past Surgical History:  Procedure Laterality Date  . COLONOSCOPY W/ BIOPSIES  02/22/2009   tubular adenoma  . LAPAROSCOPIC APPENDECTOMY  10/03/2011   Procedure: APPENDECTOMY LAPAROSCOPIC;  Surgeon: Ernestene MentionHaywood M Ingram, MD;  Location: [facility];  Service: General;  Laterality: N/A;    Current Outpatient Medications  Medication Sig Dispense Refill  . DULoxetine (CYMBALTA) 30 MG capsule Take 1 capsule by mouth daily.    Marland Kitchen. escitalopram (LEXAPRO)  10 MG tablet Take 1 tablet by mouth daily.    . rosuvastatin (CRESTOR) 10 MG tablet Take 10 mg by mouth daily.    Marland Kitchen. SYNTHROID 112 MCG tablet Take 1 tablet by mouth daily.    . valACYclovir (VALTREX) 1000 MG tablet Take 1 tablet (1,000 mg total) by mouth daily. 90 tablet 3  . Vitamin D, Ergocalciferol, (DRISDOL) 50000 units CAPS capsule Take 1 capsule by mouth once a week.     No current facility-administered medications for this visit.     Family History  Problem Relation Age of Onset  . Breast cancer Mother [id number]       breast - died age 59  . Hyperlipidemia Father   . Hyperlipidemia Sister   . Diabetes Sister   . Breast cancer Sister 3867       Lumpectomy and Radiation  . Diabetes Brother   . Diabetes Sister     ROS:  Pertinent items are noted in HPI.  Otherwise, a comprehensive ROS was negative.  Exam:   BP 114/76   Pulse 70   Resp 16   Ht 5' (1.524 m)   Wt 161 lb (73 kg)   LMP 01/28/2011 (Approximate) Comment: postmenopausal  BMI 31.44 kg/m  Height: 5' (152.4 cm) Ht Readings from Last 3 Encounters:  01/06/17 5' (1.524 m)  10/24/16 5' (1.524 m)  01/01/16 5' 0.25" (1.53 m)  General appearance: alert, cooperative and appears stated age Head: Normocephalic, without obvious abnormality, atraumatic Neck: no adenopathy, supple, symmetrical, trachea midline and thyroid normal to inspection and palpation Lungs: clear to auscultation bilaterally Breasts: normal appearance, no masses or tenderness, No nipple retraction or dimpling, No nipple discharge or bleeding, No axillary or supraclavicular adenopathy Heart: regular rate and rhythm Abdomen: soft, non-tender; no masses,  no organomegaly Extremities: extremities normal, atraumatic, no cyanosis or edema Skin: Skin color, texture, turgor normal. No rashes or lesions Lymph nodes: Cervical, supraclavicular, and axillary nodes normal. No abnormal inguinal nodes palpated Neurologic: Grossly normal   Pelvic: External genitalia:   no lesions              Urethra:  normal appearing urethra with no masses, tenderness or lesions              Bartholin's and Skene's: normal                 Vagina: normal appearing vagina with normal color and discharge, no lesions              Cervix: no cervical motion tenderness, no lesions and stenotic os, attempted x 3 for endo cervical collection              Pap taken: Yes.   Bimanual Exam:  Uterus:  normal size, contour, position, consistency, mobility, non-tender and anteverted              Adnexa: normal adnexa and no mass, fullness, tenderness               Rectovaginal: Confirms               Anus:  normal sphincter tone, no lesions  Chaperone present: yes  A:  Well Woman with normal exam  Menopausal no HRT  Vaginal dryness using coconut oil with good results  History of HSV 2 needs Rx refill  Hypothyroid/anxety/cholesterol with PCP management  Family history this year of cervical cancer sister 5170 Family history of breast cancer mother age 34/sister age 267  P:   Reviewed health and wellness pertinent to exam  Aware of need to evaluate with vaginal bleeding occurrence  Will advise if this is not working well.  Rx valtrex see order with instructions  Continue follow up with PCP as indicated  Stressed importance of SBE and mammogram, also genetic evaluation . Declines had discussed with P.Grubb prior.  Pap smear: yes    counseled on breast self exam, mammography screening, feminine hygiene, adequate intake of calcium and vitamin D, diet and exercise, Kegel's exercises  return annually or prn  An After Visit Summary was printed and given to the patient.

## 2017-01-06 NOTE — Patient Instructions (Signed)
EXERCISE AND DIET:  We recommended that you start or continue a regular exercise program for good health. Regular exercise means any activity that makes your heart beat faster and makes you sweat.  We recommend exercising at least 30 minutes per day at least 3 days a week, preferably 4 or 5.  We also recommend a diet low in fat and sugar.  Inactivity, poor dietary choices and obesity can cause diabetes, heart attack, stroke, and kidney damage, among others.    ALCOHOL AND SMOKING:  Women should limit their alcohol intake to no more than 7 drinks/beers/glasses of wine (combined, not each!) per week. Moderation of alcohol intake to this level decreases your risk of breast cancer and liver damage. And of course, no recreational drugs are part of a healthy lifestyle.  And absolutely no smoking or even second hand smoke. Most people know smoking can cause heart and lung diseases, but did you know it also contributes to weakening of your bones? Aging of your skin?  Yellowing of your teeth and nails?  CALCIUM AND VITAMIN D:  Adequate intake of calcium and Vitamin D are recommended.  The recommendations for exact amounts of these supplements seem to change often, but generally speaking 600 mg of calcium (either carbonate or citrate) and 800 units of Vitamin D per day seems prudent. Certain women may benefit from higher intake of Vitamin D.  If you are among these women, your doctor will have told you during your visit.    PAP SMEARS:  Pap smears, to check for cervical cancer or precancers,  have traditionally been done yearly, although recent scientific advances have shown that most women can have pap smears less often.  However, every woman still should have a physical exam from her gynecologist every year. It will include a breast check, inspection of the vulva and vagina to check for abnormal growths or skin changes, a visual exam of the cervix, and then an exam to evaluate the size and shape of the uterus and  ovaries.  And after 59 years of age, a rectal exam is indicated to check for rectal cancers. We will also provide age appropriate advice regarding health maintenance, like when you should have certain vaccines, screening for sexually transmitted diseases, bone density testing, colonoscopy, mammograms, etc.   MAMMOGRAMS:  All women over 40 years old should have a yearly mammogram. Many facilities now offer a "3D" mammogram, which may cost around $50 extra out of pocket. If possible,  we recommend you accept the option to have the 3D mammogram performed.  It both reduces the number of women who will be called back for extra views which then turn out to be normal, and it is better than the routine mammogram at detecting truly abnormal areas.    COLONOSCOPY:  Colonoscopy to screen for colon cancer is recommended for all women at age 50.  We know, you hate the idea of the prep.  We agree, BUT, having colon cancer and not knowing it is worse!!  Colon cancer so often starts as a polyp that can be seen and removed at colonscopy, which can quite literally save your life!  And if your first colonoscopy is normal and you have no family history of colon cancer, most women don't have to have it again for 10 years.  Once every ten years, you can do something that may end up saving your life, right?  We will be happy to help you get it scheduled when you are ready.    Be sure to check your insurance coverage so you understand how much it will cost.  It may be covered as a preventative service at no cost, but you should check your particular policy.      Atrophic Vaginitis Atrophic vaginitis is when the tissues that line the vagina become dry and thin. This is caused by a drop in estrogen. Estrogen helps:  To keep the vagina moist.  To make a clear fluid that helps: ? To lubricate the vagina for sex. ? To protect the vagina from infection.  If the lining of the vagina is dry and thin, it may:  Make sex painful. It  may also cause bleeding.  Cause a feeling of: ? Burning. ? Irritation. ? Itchiness.  Make an exam of your vagina painful. It may also cause bleeding.  Make you lose interest in sex.  Cause a burning feeling when you pee.  Make your vaginal fluid (discharge) brown or yellow.  For some women, there are no symptoms. This condition is most common in women who do not get their regular menstrual periods anymore (menopause). This often starts when a woman is 45-55 years old. Follow these instructions at home:  Take medicines only as told by your doctor. Do not use any herbal or alternative medicines unless your doctor says it is okay.  Use over-the-counter products for dryness only as told by your doctor. These include: ? Creams. ? Lubricants. ? Moisturizers.  Do not douche.  Do not use products that can make your vagina dry. These include: ? Scented feminine sprays. ? Scented tampons. ? Scented soaps.  If it hurts to have sex, tell your sexual partner. Contact a doctor if:  Your discharge looks different than normal.  Your vagina has an unusual smell.  You have new symptoms.  Your symptoms do not get better with treatment.  Your symptoms get worse. This information is not intended to replace advice given to you by your health care provider. Make sure you discuss any questions you have with your health care provider. Document Released: 07/02/2007 Document Revised: 06/21/2015 Document Reviewed: 01/04/2014 Elsevier Interactive Patient Education  2018 Elsevier Inc.  

## 2017-01-09 LAB — CYTOLOGY - PAP
DIAGNOSIS: NEGATIVE
HPV: NOT DETECTED

## 2017-01-12 ENCOUNTER — Ambulatory Visit (INDEPENDENT_AMBULATORY_CARE_PROVIDER_SITE_OTHER): Payer: BLUE CROSS/BLUE SHIELD | Admitting: Orthopedic Surgery

## 2017-01-12 ENCOUNTER — Encounter (INDEPENDENT_AMBULATORY_CARE_PROVIDER_SITE_OTHER): Payer: Self-pay | Admitting: Orthopedic Surgery

## 2017-01-12 ENCOUNTER — Ambulatory Visit (INDEPENDENT_AMBULATORY_CARE_PROVIDER_SITE_OTHER): Payer: BLUE CROSS/BLUE SHIELD

## 2017-01-12 DIAGNOSIS — M25521 Pain in right elbow: Secondary | ICD-10-CM

## 2017-01-12 DIAGNOSIS — M7711 Lateral epicondylitis, right elbow: Secondary | ICD-10-CM

## 2017-01-12 NOTE — Progress Notes (Signed)
Office Visit Note   Patient: Kimberly Dorsey           Date of Birth: 09/18/1955           MRN: [id number] Visit Date: 01/12/2017              Requested by: Adrian PrinceSouth, Stephen, MD [address] [place], [place] [address] PCP: Adrian PrinceSouth, Stephen, MD  Chief Complaint  Patient presents with  . Right Foot - Follow-up  . Right Elbow - Pain      HPI: Patient is a 61 year old woman who presents in follow-up for 2 separate issues she is status post treatment for peroneal tendinitis on the right she states she occasionally wears some compression stockings.  She states she has been having right lateral epicondyle pain for about a week and a half.  She states she was cleaning scrubbing and feels like she overdid.  Assessment & Plan: Visit Diagnoses:  1. Pain in right elbow   2. Lateral epicondylitis, right elbow     Plan: The right lateral condyle was injected.  Patient tolerated this well she will discontinue the sling use her arm as necessary follow-up as needed.  Recommend continue with the compression stockings for the venous insufficiency to the right leg.  Follow-Up Instructions: Return if symptoms worsen or fail to improve.   Ortho Exam  Patient is alert, oriented, no adenopathy, well-dressed, normal affect, normal respiratory effort. Examination patient has swelling over the lateral epicondyle of her right elbow.  There is no redness no cellulitis.  She is point tender to palpation of the lateral epicondyle.  Resisted extension of the wrist reproduces the pain.  She has full supination and pronation no pain to palpation over the radial head.  Examination the right ankle she does have some varicose veins with swelling she does not have any tenderness to palpation over the peroneal tendons at this time.  Imaging: Xr Elbow 2 Views Right  Result Date: 01/12/2017 2 view radiographs of the right elbow shows a congruent joint there is no effusion no malalignment or degenerative changes.  No  images are attached to the encounter.  Labs: Lab Results  Component Value Date   ESRSEDRATE 6 11/24/2016   CRP 2.7 11/24/2016   LABURIC 6.1 11/24/2016   LABURIC 6.3 10/24/2016    @LABSALLVALUES (HGBA1)@  There is no height or weight on file to calculate BMI.  Orders:  Orders Placed This Encounter  Procedures  . XR Elbow 2 Views Right   No orders of the defined types were placed in this encounter.    Procedures: Medium Joint Inj: R lateral epicondyle on 01/12/2017 11:36 AM Indications: pain and diagnostic evaluation Details: 22 G 1.5 in needle, lateral approach Medications: 2 mL lidocaine 1 %; 40 mg methylPREDNISolone acetate 40 MG/ML Outcome: tolerated well, no immediate complications Procedure, treatment alternatives, risks and benefits explained, specific risks discussed. Consent was given by the patient. Immediately prior to procedure a time out was called to verify the correct patient, procedure, equipment, support staff and site/side marked as required. Patient was prepped and draped in the usual sterile fashion.      Clinical Data: No additional findings.  ROS:  All other systems negative, except as noted in the HPI. Review of Systems  Objective: Vital Signs: LMP 01/28/2011 (Approximate) Comment: postmenopausal  Specialty Comments:  No specialty comments available.  PMFS History: Patient Active Problem List   Diagnosis Date Noted  . Peroneus brevis tendinitis, right 11/03/2016  . Acquired hypothyroidism  01/09/2014  . Bilateral low back pain without sciatica 01/09/2014  . Pure hypercholesterolemia 01/09/2014  . Atrophic vaginitis 11/16/2012  . Unspecified hypothyroidism 11/16/2012  . 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 Bulging disc    lower back   . Depression    situational  . Graves disease 9/96   hx of RAI  . HSV infection   . Hypothyroidism   . Thyroid disease     Family History  Problem Relation Age of  Onset  . Breast cancer Mother 4439       breast - died age 59  . Hyperlipidemia Father   . Hyperlipidemia Sister   . Diabetes Sister   . Breast cancer Sister 5767       Lumpectomy and Radiation  . Diabetes Brother   . Diabetes Sister     Past Surgical History:  Procedure Laterality Date  . COLONOSCOPY W/ BIOPSIES  02/22/2009   tubular adenoma  . LAPAROSCOPIC APPENDECTOMY  10/03/2011   Procedure: APPENDECTOMY LAPAROSCOPIC;  Surgeon: Ernestene MentionHaywood M Ingram, MD;  Location: [facility];  Service: General;  Laterality: N/A;   Social History   Occupational History    Employer: [employer]  Tobacco Use  . Smoking status: Former Smoker    Packs/day: 0.25    Years: 5.00    Pack years: 1.25  . Smokeless tobacco: Never Used  Substance and Sexual Activity  . Alcohol use: Yes    Alcohol/week: 3.0 oz    Types: 5 Glasses of wine per week  . Drug use: No  . Sexual activity: Yes    Partners: Male    Birth control/protection: Post-menopausal

## 2017-01-13 MED ORDER — LIDOCAINE HCL 1 % IJ SOLN
2.0000 mL | INTRAMUSCULAR | Status: AC | PRN
  Administered 2017-01-12: 2 mL

## 2017-01-13 MED ORDER — METHYLPREDNISOLONE ACETATE 40 MG/ML IJ SUSP
40.0000 mg | INTRAMUSCULAR | Status: AC | PRN
  Administered 2017-01-12: 40 mg via INTRA_ARTICULAR

## 2017-02-24 ENCOUNTER — Ambulatory Visit (INDEPENDENT_AMBULATORY_CARE_PROVIDER_SITE_OTHER): Payer: BLUE CROSS/BLUE SHIELD | Admitting: Orthopedic Surgery

## 2017-02-24 ENCOUNTER — Encounter (INDEPENDENT_AMBULATORY_CARE_PROVIDER_SITE_OTHER): Payer: Self-pay | Admitting: Orthopedic Surgery

## 2017-02-24 VITALS — Ht 60.0 in | Wt 161.0 lb

## 2017-02-24 DIAGNOSIS — M7671 Peroneal tendinitis, right leg: Secondary | ICD-10-CM

## 2017-02-24 DIAGNOSIS — M7711 Lateral epicondylitis, right elbow: Secondary | ICD-10-CM | POA: Diagnosis not present

## 2017-02-24 MED ORDER — PREDNISONE 10 MG PO TABS
10.0000 mg | ORAL_TABLET | Freq: Every day | ORAL | 1 refills | Status: DC
Start: 2017-02-24 — End: 2018-01-12

## 2017-02-24 NOTE — Progress Notes (Signed)
Office Visit Note   Patient: Kimberly Dorsey           Date of Birth: 09/22/1955           MRN: [id number] Visit Date: 02/24/2017              Requested by: Adrian PrinceSouth, Stephen, MD [address] [place], [place] [address] PCP: Adrian PrinceSouth, Stephen, MD  Chief Complaint  Patient presents with  . Right Elbow - Follow-up      HPI: Patient is a 62 year old woman who presents in follow-up for her right elbow and right peroneus brevis.  She states that there is still swelling along the peroneus brevis but she states that symptomatically it is better.  He states he states that her arm is hurting more and she states that the pain seems to be worse she is describes it more as [place] a course dorsally over the forearm and she states that the lateral epicondyle injection provided her no relief.  Assessment & Plan: Visit Diagnoses:  1. Peroneus brevis tendinitis, right   2. Lateral epicondylitis, right elbow     Plan: Discussed that her symptoms seem to be more radicular pain from impingement in the arcade approach.  We will try a prednisone course to help both the nerve impingement and the peroneus brevis pain.  Reevaluate in 4 weeks.  Discussed the surgical options for reconstruction of the peroneus brevis and patient agrees she does not want to proceed with surgical intervention at this time.  Follow-Up Instructions: Return in about 4 weeks (around 03/24/2017).   Ortho Exam  Patient is alert, oriented, no adenopathy, well-dressed, normal affect, normal respiratory effort. Examination patient's lateral condyle is nontender to palpation at this time resisted extension of the wrist is nonpainful she does have tenderness to palpation along the dorsum of the forearm which is more in line with the radial nerve than her extensor mass.  She still has swelling over the peroneus brevis and is tender to palpation along the course of the peroneus brevis.  She has a good dorsalis pedis pulse good ankle good subtalar  motion.  Imaging: No results found. No images are attached to the encounter.  Labs: Lab Results  Component Value Date   ESRSEDRATE 6 11/24/2016   CRP 2.7 11/24/2016   LABURIC 6.1 11/24/2016   LABURIC 6.3 10/24/2016    @LABSALLVALUES (HGBA1)@  Body mass index is 31.44 kg/m.  Orders:  No orders of the defined types were placed in this encounter.  Meds ordered this encounter  Medications  . predniSONE (DELTASONE) 10 MG tablet    Sig: Take 1 tablet (10 mg total) by mouth daily with breakfast.    Dispense:  30 tablet    Refill:  1     Procedures: No procedures performed  Clinical Data: No additional findings.  ROS:  All other systems negative, except as noted in the HPI. Review of Systems  Objective: Vital Signs: Ht 5' (1.524 m)   Wt 161 lb (73 kg)   LMP 01/28/2011 (Approximate) Comment: postmenopausal  BMI 31.44 kg/m   Specialty Comments:  No specialty comments available.  PMFS History: Patient Active Problem List   Diagnosis Date Noted  . Peroneus brevis tendinitis, right 11/03/2016  . Acquired hypothyroidism 01/09/2014  . Bilateral low back pain without sciatica 01/09/2014  . Pure hypercholesterolemia 01/09/2014  . Atrophic vaginitis 11/16/2012  . Unspecified hypothyroidism 11/16/2012  . 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 Bulging  disc    lower back   . Depression    situational  . Graves disease 9/96   hx of RAI  . HSV infection   . Hypothyroidism   . Thyroid disease     Family History  Problem Relation Age of Onset  . Breast cancer Mother 18       breast - died age 43  . Hyperlipidemia Father   . Hyperlipidemia Sister   . Diabetes Sister   . Breast cancer Sister 68       Lumpectomy and Radiation  . Diabetes Brother   . Diabetes Sister     Past Surgical History:  Procedure Laterality Date  . COLONOSCOPY W/ BIOPSIES  02/22/2009   tubular adenoma  . LAPAROSCOPIC APPENDECTOMY  10/03/2011    Procedure: APPENDECTOMY LAPAROSCOPIC;  Surgeon: Ernestene Mention, MD;  Location: [facility];  Service: General;  Laterality: N/A;   Social History   Occupational History    Employer: [employer]  Tobacco Use  . Smoking status: Former Smoker    Packs/day: 0.25    Years: 5.00    Pack years: 1.25  . Smokeless tobacco: Never Used  Substance and Sexual Activity  . Alcohol use: Yes    Alcohol/week: 3.0 oz    Types: 5 Glasses of wine per week  . Drug use: No  . Sexual activity: Yes    Partners: Male    Birth control/protection: Post-menopausal

## 2017-03-16 DIAGNOSIS — E7849 Other hyperlipidemia: Secondary | ICD-10-CM | POA: Diagnosis not present

## 2017-03-16 DIAGNOSIS — D126 Benign neoplasm of colon, unspecified: Secondary | ICD-10-CM | POA: Diagnosis not present

## 2017-03-16 DIAGNOSIS — Z1389 Encounter for screening for other disorder: Secondary | ICD-10-CM | POA: Diagnosis not present

## 2017-03-16 DIAGNOSIS — E559 Vitamin D deficiency, unspecified: Secondary | ICD-10-CM | POA: Diagnosis not present

## 2017-03-16 DIAGNOSIS — E038 Other specified hypothyroidism: Secondary | ICD-10-CM | POA: Diagnosis not present

## 2017-03-24 ENCOUNTER — Ambulatory Visit (INDEPENDENT_AMBULATORY_CARE_PROVIDER_SITE_OTHER): Payer: BLUE CROSS/BLUE SHIELD | Admitting: Orthopedic Surgery

## 2017-04-01 ENCOUNTER — Encounter (INDEPENDENT_AMBULATORY_CARE_PROVIDER_SITE_OTHER): Payer: Self-pay | Admitting: Orthopedic Surgery

## 2017-04-01 ENCOUNTER — Ambulatory Visit (INDEPENDENT_AMBULATORY_CARE_PROVIDER_SITE_OTHER): Payer: BLUE CROSS/BLUE SHIELD | Admitting: Orthopedic Surgery

## 2017-04-01 VITALS — Ht 60.0 in | Wt 161.0 lb

## 2017-04-01 DIAGNOSIS — M7671 Peroneal tendinitis, right leg: Secondary | ICD-10-CM

## 2017-04-01 DIAGNOSIS — M7711 Lateral epicondylitis, right elbow: Secondary | ICD-10-CM

## 2017-04-01 MED ORDER — METHYLPREDNISOLONE ACETATE 40 MG/ML IJ SUSP
40.0000 mg | INTRAMUSCULAR | Status: AC | PRN
  Administered 2017-04-01: 40 mg via INTRA_ARTICULAR

## 2017-04-01 MED ORDER — LIDOCAINE HCL 1 % IJ SOLN
2.0000 mL | INTRAMUSCULAR | Status: AC | PRN
  Administered 2017-04-01: 2 mL

## 2017-04-01 NOTE — Progress Notes (Signed)
Office Visit Note   Patient: Kimberly Dorsey           Date of Birth: 07/24/1955           MRN: [id number] Visit Date: 04/01/2017              Requested by: Adrian PrinceSouth, Stephen, MD [address] [place], [place] [id number] PCP: Adrian PrinceSouth, Stephen, MD  Chief Complaint  Patient presents with  . Right Elbow - Pain  . Right Ankle - Pain      HPI: Patient presents in follow-up for lateral epicondylitis of the right elbow and peroneal brevis tendinitis on the right.  She states the prednisone has not really changed the peroneal tendon symptoms.  She states that the pain over the lateral condyle that radiated distally is improving but is still present.  She states the prednisone helped the pinched nerve symptoms.  Assessment & Plan: Visit Diagnoses:  1. Peroneus brevis tendinitis, right   2. Lateral epicondylitis, right elbow     Plan: The lateral condyle was injected again.  Discussed that we could consider one additional injection.  Recommended exercising for the venous swelling and recommended using heels to unload the peroneus brevis.  Discussed about surgical intervention for the peroneus brevis if necessary.  Recommended compression stockings to help with the swelling.  Follow-Up Instructions: Return if symptoms worsen or fail to improve.   Ortho Exam  Patient is alert, oriented, no adenopathy, well-dressed, normal affect, normal respiratory effort. On examination patient is tender to palpation over the lateral epicondyle resisted extension of the wrist reproduces her pain.  She states she still symptomatic over the peroneus brevis.  She does have some venous swelling.  Imaging: No results found. No images are attached to the encounter.  Labs: Lab Results  Component Value Date   ESRSEDRATE 6 11/24/2016   CRP 2.7 11/24/2016   LABURIC 6.1 11/24/2016   LABURIC 6.3 10/24/2016    @LABSALLVALUES (HGBA1)@  Body mass index is 31.44 kg/m.  Orders:  No orders of the defined types were  placed in this encounter.  No orders of the defined types were placed in this encounter.    Procedures: Medium Joint Inj: R lateral epicondyle on 04/01/2017 5:46 PM Indications: pain and diagnostic evaluation Details: 22 G 1.5 in needle, lateral approach Medications: 2 mL lidocaine 1 %; 40 mg methylPREDNISolone acetate 40 MG/ML Outcome: tolerated well, no immediate complications Procedure, treatment alternatives, risks and benefits explained, specific risks discussed. Consent was given by the patient. Immediately prior to procedure a time out was called to verify the correct patient, procedure, equipment, support staff and site/side marked as required. Patient was prepped and draped in the usual sterile fashion.      Clinical Data: No additional findings.  ROS:  All other systems negative, except as noted in the HPI. Review of Systems  Objective: Vital Signs: Ht 5' (1.524 m)   Wt 161 lb (73 kg)   LMP 01/28/2011 (Approximate) Comment: postmenopausal  BMI 31.44 kg/m   Specialty Comments:  No specialty comments available.  PMFS History: Patient Active Problem List   Diagnosis Date Noted  . Peroneus brevis tendinitis, right 11/03/2016  . Acquired hypothyroidism 01/09/2014  . Bilateral low back pain without sciatica 01/09/2014  . Pure hypercholesterolemia 01/09/2014  . Atrophic vaginitis 11/16/2012  . Unspecified hypothyroidism 11/16/2012  . 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 Bulging disc    lower back   . Depression  situational  . Graves disease 9/96   hx of RAI  . HSV infection   . Hypothyroidism   . Thyroid disease     Family History  Problem Relation Age of Onset  . Breast cancer Mother 47       breast - died age 51  . Hyperlipidemia Father   . Hyperlipidemia Sister   . Diabetes Sister   . Breast cancer Sister 63       Lumpectomy and Radiation  . Diabetes Brother   . Diabetes Sister     Past Surgical History:    Procedure Laterality Date  . COLONOSCOPY W/ BIOPSIES  02/22/2009   tubular adenoma  . LAPAROSCOPIC APPENDECTOMY  10/03/2011   Procedure: APPENDECTOMY LAPAROSCOPIC;  Surgeon: Ernestene Mention, MD;  Location: [facility];  Service: General;  Laterality: N/A;   Social History   Occupational History    Employer: [employer]  Tobacco Use  . Smoking status: Former Smoker    Packs/day: 0.25    Years: 5.00    Pack years: 1.25  . Smokeless tobacco: Never Used  Substance and Sexual Activity  . Alcohol use: Yes    Alcohol/week: 3.0 oz    Types: 5 Glasses of wine per week  . Drug use: No  . Sexual activity: Yes    Partners: Male    Birth control/protection: Post-menopausal

## 2017-05-22 DIAGNOSIS — Z1231 Encounter for screening mammogram for malignant neoplasm of breast: Secondary | ICD-10-CM | POA: Diagnosis not present

## 2017-05-22 DIAGNOSIS — Z803 Family history of malignant neoplasm of breast: Secondary | ICD-10-CM | POA: Diagnosis not present

## 2017-06-01 ENCOUNTER — Encounter: Payer: Self-pay | Admitting: Certified Nurse Midwife

## 2017-06-04 DIAGNOSIS — B9789 Other viral agents as the cause of diseases classified elsewhere: Secondary | ICD-10-CM | POA: Diagnosis not present

## 2017-06-04 DIAGNOSIS — J069 Acute upper respiratory infection, unspecified: Secondary | ICD-10-CM | POA: Diagnosis not present

## 2017-06-09 DIAGNOSIS — Z6830 Body mass index (BMI) 30.0-30.9, adult: Secondary | ICD-10-CM | POA: Diagnosis not present

## 2017-06-09 DIAGNOSIS — J40 Bronchitis, not specified as acute or chronic: Secondary | ICD-10-CM | POA: Diagnosis not present

## 2017-06-09 DIAGNOSIS — R05 Cough: Secondary | ICD-10-CM | POA: Diagnosis not present

## 2017-06-25 DIAGNOSIS — H43812 Vitreous degeneration, left eye: Secondary | ICD-10-CM | POA: Diagnosis not present

## 2017-09-09 DIAGNOSIS — E7849 Other hyperlipidemia: Secondary | ICD-10-CM | POA: Diagnosis not present

## 2017-09-09 DIAGNOSIS — E038 Other specified hypothyroidism: Secondary | ICD-10-CM | POA: Diagnosis not present

## 2017-09-09 DIAGNOSIS — E559 Vitamin D deficiency, unspecified: Secondary | ICD-10-CM | POA: Diagnosis not present

## 2017-09-18 DIAGNOSIS — E7849 Other hyperlipidemia: Secondary | ICD-10-CM | POA: Diagnosis not present

## 2017-09-18 DIAGNOSIS — R7301 Impaired fasting glucose: Secondary | ICD-10-CM | POA: Diagnosis not present

## 2017-09-18 DIAGNOSIS — Z0000 Encounter for general adult medical examination without abnormal findings: Secondary | ICD-10-CM | POA: Diagnosis not present

## 2017-09-18 DIAGNOSIS — E038 Other specified hypothyroidism: Secondary | ICD-10-CM | POA: Diagnosis not present

## 2017-09-18 DIAGNOSIS — F329 Major depressive disorder, single episode, unspecified: Secondary | ICD-10-CM | POA: Diagnosis not present

## 2017-09-25 DIAGNOSIS — Z1212 Encounter for screening for malignant neoplasm of rectum: Secondary | ICD-10-CM | POA: Diagnosis not present

## 2018-01-05 DIAGNOSIS — Z8669 Personal history of other diseases of the nervous system and sense organs: Secondary | ICD-10-CM | POA: Diagnosis not present

## 2018-01-05 DIAGNOSIS — H8110 Benign paroxysmal vertigo, unspecified ear: Secondary | ICD-10-CM | POA: Diagnosis not present

## 2018-01-05 DIAGNOSIS — H5500 Unspecified nystagmus: Secondary | ICD-10-CM | POA: Diagnosis not present

## 2018-01-12 ENCOUNTER — Encounter: Payer: Self-pay | Admitting: Certified Nurse Midwife

## 2018-01-12 ENCOUNTER — Other Ambulatory Visit: Payer: Self-pay

## 2018-01-12 ENCOUNTER — Ambulatory Visit (INDEPENDENT_AMBULATORY_CARE_PROVIDER_SITE_OTHER): Payer: BLUE CROSS/BLUE SHIELD | Admitting: Certified Nurse Midwife

## 2018-01-12 VITALS — BP 110/84 | HR 68 | Resp 16 | Ht 59.75 in | Wt 164.0 lb

## 2018-01-12 DIAGNOSIS — N951 Menopausal and female climacteric states: Secondary | ICD-10-CM | POA: Diagnosis not present

## 2018-01-12 DIAGNOSIS — A6009 Herpesviral infection of other urogenital tract: Secondary | ICD-10-CM | POA: Diagnosis not present

## 2018-01-12 DIAGNOSIS — Z01419 Encounter for gynecological examination (general) (routine) without abnormal findings: Secondary | ICD-10-CM | POA: Diagnosis not present

## 2018-01-12 MED ORDER — VALACYCLOVIR HCL 1 G PO TABS: 1000.0000 mg | ORAL_TABLET | Freq: Every day | ORAL | 3 refills | Status: DC

## 2018-01-12 NOTE — Patient Instructions (Signed)

## 2018-01-12 NOTE — Progress Notes (Signed)
62 y.o. G17P0040 Married  Caucasian Fe here for annual exam. Menopausal no vaginal bleeding. Some vaginal dryness, but no issues not sexually active. Dealing with vertigo for the first time, on Rx and seeing ENT in am. Sees PCP for aex and labs, Cymbalta, Lexapro, Cholesterol and Hypothyroid management. All stable per patient. Occasional HSV outbreak will need Rx update. No other health issues today. Patient's last menstrual period was 01/28/2011 (approximate).          Sexually active: No.  The current method of family planning is post menopausal status.    Exercising: No.  exercise Smoker:  no  Review of Systems  Constitutional: Negative.   HENT:       Headache, vertigo  Eyes: Negative.   Respiratory: Negative.   Cardiovascular: Negative.   Gastrointestinal: Negative.   Genitourinary: Negative.   Musculoskeletal: Negative.   Skin: Negative.   Neurological: Negative.   Endo/Heme/Allergies: Negative.   Psychiatric/Behavioral: Positive for depression.    Health Maintenance: Pap:  12-20-14 neg HPV HR neg, 01-06-17 neg HPV HR neg History of Abnormal Pap: no MMG:  05-22-17 category c density birads 2:neg Self Breast exams: yes Colonoscopy:  2016 f/u 66yrs BMD:   none TDaP:  2011 Shingles: no Pneumonia: no Hep C and HIV: HIV neg 2008, Hep c neg 2016 Labs: PCP   reports that she has quit smoking. She has a 1.25 pack-year smoking history. She has never used smokeless tobacco. She reports current alcohol use of about 5.0 standard drinks of alcohol per week. She reports that she does not use drugs.  Past Medical History:  Diagnosis Date  . Bulging disc    lower back   . Depression    situational  . Graves disease 9/96   hx of RAI  . HSV infection   . Hypothyroidism   . Thyroid disease   . Vertigo     Past Surgical History:  Procedure Laterality Date  . COLONOSCOPY W/ BIOPSIES  02/22/2009   tubular adenoma  . LAPAROSCOPIC APPENDECTOMY  10/03/2011   Procedure: APPENDECTOMY  LAPAROSCOPIC;  Surgeon: Ernestene Mention, MD;  Location: [facility];  Service: General;  Laterality: N/A;    Current Outpatient Medications  Medication Sig Dispense Refill  . DULoxetine (CYMBALTA) 60 MG capsule Take 60 mg by mouth daily.    Marland Kitchen escitalopram (LEXAPRO) 10 MG tablet Take 1 tablet by mouth daily.    . meclizine (ANTIVERT) 25 MG tablet Take 25 mg by mouth 3 (three) times daily as needed for dizziness.    . rosuvastatin (CRESTOR) 10 MG tablet Take 10 mg by mouth. 3 times a week    . SYNTHROID 112 MCG tablet Take 1 tablet by mouth daily.    . valACYclovir (VALTREX) 1000 MG tablet Take 1 tablet (1,000 mg total) by mouth daily. 90 tablet 3  . Vitamin D, Ergocalciferol, (DRISDOL) 50000 units CAPS capsule Take 1 capsule by mouth once a week.     No current facility-administered medications for this visit.     Family History  Problem Relation Age of Onset  . Breast cancer Mother 52       breast - died age 75  . Hyperlipidemia Father   . Hyperlipidemia Sister   . Diabetes Sister   . Breast cancer Sister 56       Lumpectomy and Radiation  . Diabetes Brother   . Diabetes Sister     ROS:  Pertinent items are noted in HPI.  Otherwise, a comprehensive ROS  was negative.  Exam:   BP 110/84   Pulse 68   Resp 16   Ht 4' 11.75" (1.518 m)   Wt 164 lb (74.4 kg)   LMP 01/28/2011 (Approximate) Comment: postmenopausal  BMI 32.30 kg/m  Height: 4' 11.75" (151.8 cm) Ht Readings from Last 3 Encounters:  01/12/18 4' 11.75" (1.518 m)  04/01/17 5' (1.524 m)  02/24/17 5' (1.524 m)    General appearance: alert, cooperative and appears stated age Head: Normocephalic, without obvious abnormality, atraumatic Neck: no adenopathy, supple, symmetrical, trachea midline and thyroid normal to inspection and palpation Lungs: clear to auscultation bilaterally Breasts: normal appearance, no masses or tenderness, No nipple retraction or dimpling, No nipple discharge or bleeding, No axillary or  supraclavicular adenopathy Heart: regular rate and rhythm Abdomen: soft, non-tender; no masses,  no organomegaly Extremities: extremities normal, atraumatic, no cyanosis or edema Skin: Skin color, texture, turgor normal. No rashes or lesions Lymph nodes: Cervical, supraclavicular, and axillary nodes normal. No abnormal inguinal nodes palpated Neurologic: Grossly normal   Pelvic: External genitalia:  no lesions              Urethra:  normal appearing urethra with no masses, tenderness or lesions              Bartholin's and Skene's: normal                 Vagina: normal appearing vagina with normal color and discharge, no lesions              Cervix: no cervical motion tenderness, no lesions and normal appearance              Pap taken: No. Bimanual Exam:  Uterus:  normal size, contour, position, consistency, mobility, non-tender              Adnexa: normal adnexa and no mass, fullness, tenderness               Rectovaginal: Confirms               Anus:  normal sphincter tone, no lesions  Chaperone present: yes  A:  Well Woman with normal exam  Menopausal no HRT  Vaginal dryness no change  Vertigo currently on medication and has ENT appointment  History of HSV needs Rx update  P:   Reviewed health and wellness pertinent to exam  Aware of need to advise if vaginal bleeding  Discussed consistent use of moisturizer for best out come. Patient not concerned with at this time.  Keep appointment with ENT.  Rx Valtrex see order with instructions  Pap smear: no   counseled on breast self exam, mammography screening, feminine hygiene, adequate intake of calcium and vitamin D, diet and exercise  return annually or prn  An After Visit Summary was printed and given to the patient.

## 2018-01-14 DIAGNOSIS — H60549 Acute eczematoid otitis externa, unspecified ear: Secondary | ICD-10-CM | POA: Diagnosis not present

## 2018-01-14 DIAGNOSIS — R42 Dizziness and giddiness: Secondary | ICD-10-CM | POA: Diagnosis not present

## 2018-02-09 DIAGNOSIS — H52203 Unspecified astigmatism, bilateral: Secondary | ICD-10-CM | POA: Diagnosis not present

## 2018-02-09 DIAGNOSIS — H2513 Age-related nuclear cataract, bilateral: Secondary | ICD-10-CM | POA: Diagnosis not present

## 2018-02-09 DIAGNOSIS — H524 Presbyopia: Secondary | ICD-10-CM | POA: Diagnosis not present

## 2018-02-09 DIAGNOSIS — H5203 Hypermetropia, bilateral: Secondary | ICD-10-CM | POA: Diagnosis not present

## 2018-03-23 DIAGNOSIS — R7301 Impaired fasting glucose: Secondary | ICD-10-CM | POA: Diagnosis not present

## 2018-03-23 DIAGNOSIS — E559 Vitamin D deficiency, unspecified: Secondary | ICD-10-CM | POA: Diagnosis not present

## 2018-03-23 DIAGNOSIS — H43812 Vitreous degeneration, left eye: Secondary | ICD-10-CM | POA: Diagnosis not present

## 2018-03-23 DIAGNOSIS — E7849 Other hyperlipidemia: Secondary | ICD-10-CM | POA: Diagnosis not present

## 2018-03-23 DIAGNOSIS — R42 Dizziness and giddiness: Secondary | ICD-10-CM | POA: Diagnosis not present

## 2018-03-23 DIAGNOSIS — E038 Other specified hypothyroidism: Secondary | ICD-10-CM | POA: Diagnosis not present

## 2018-07-15 ENCOUNTER — Encounter: Payer: Self-pay | Admitting: Certified Nurse Midwife

## 2018-07-15 DIAGNOSIS — Z803 Family history of malignant neoplasm of breast: Secondary | ICD-10-CM | POA: Diagnosis not present

## 2018-07-15 DIAGNOSIS — Z1231 Encounter for screening mammogram for malignant neoplasm of breast: Secondary | ICD-10-CM | POA: Diagnosis not present

## 2018-09-14 IMAGING — MR MR ANKLE*R* W/O CM
4 of 5 series · 13 of 40 positions shown · non-contrast
Comparison: Ankle x-rays dated October 24, 2016.

CLINICAL DATA: Right lateral ankle pain and swelling for 3 weeks.
No known injury.

EXAM:
MRI OF THE RIGHT ANKLE WITHOUT CONTRAST
TECHNIQUE: Multiplanar, multisequence MR imaging of the ankle was performed. No
intravenous contrast was administered.

[Series 4: T2 fat-sat · axial · 3.5mm · 0.22mm/px · z∈[-41,+40]mm · 3 of 29 slices shown (1 of 3)]
[im 4/29]
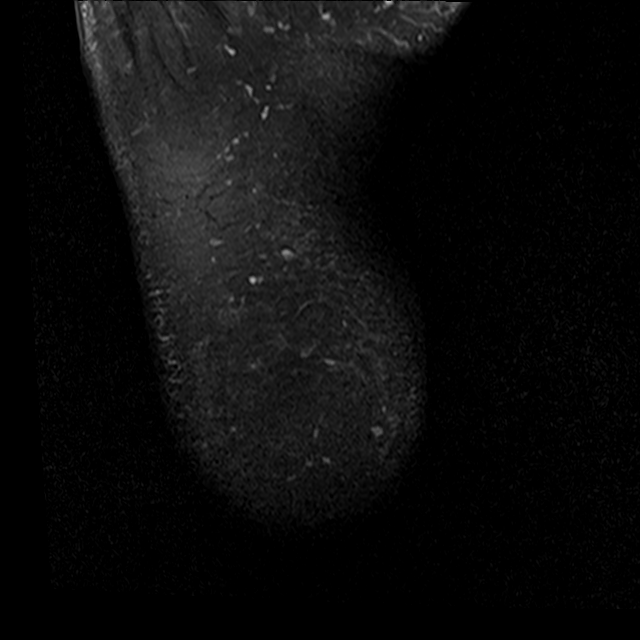
[im 15/29]
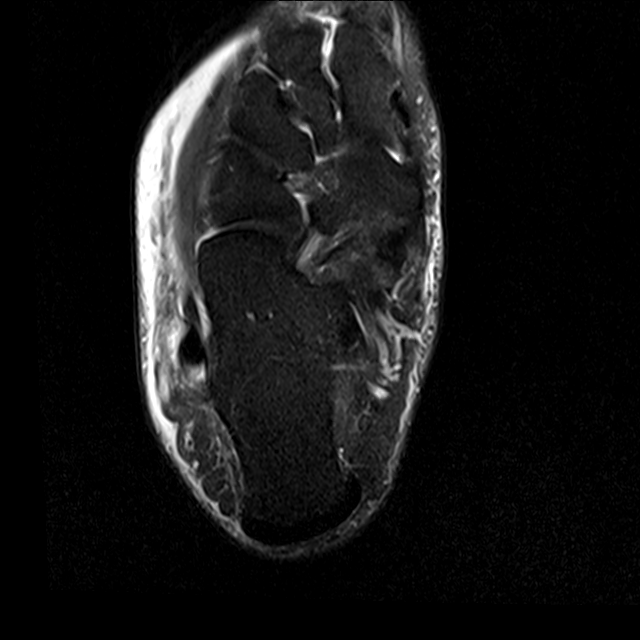
[im 25/29]
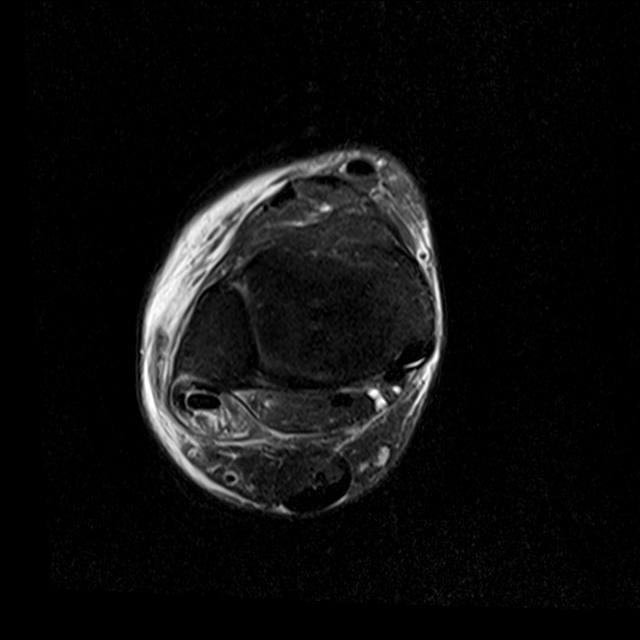

[Series 5: PD fat-sat · axial · 3.5mm · 0.22mm/px · z∈[-52,+40]mm · 4 of 29 slices shown]
[im 1/29]
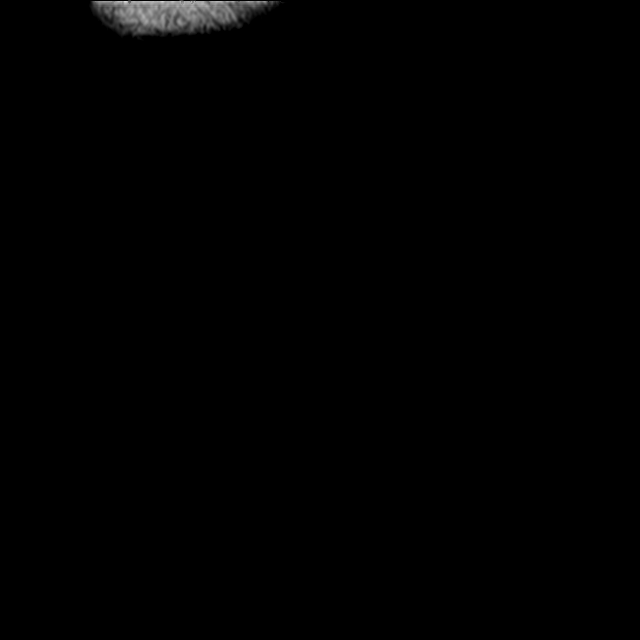
[im 4/29]
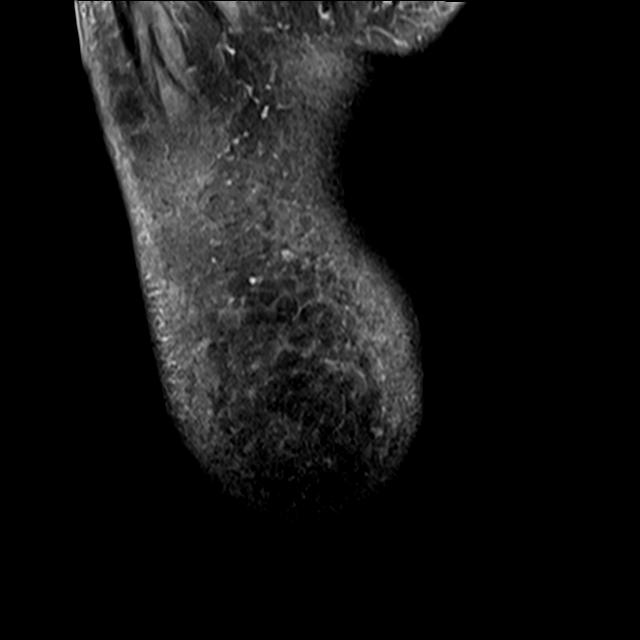
[im 15/29]
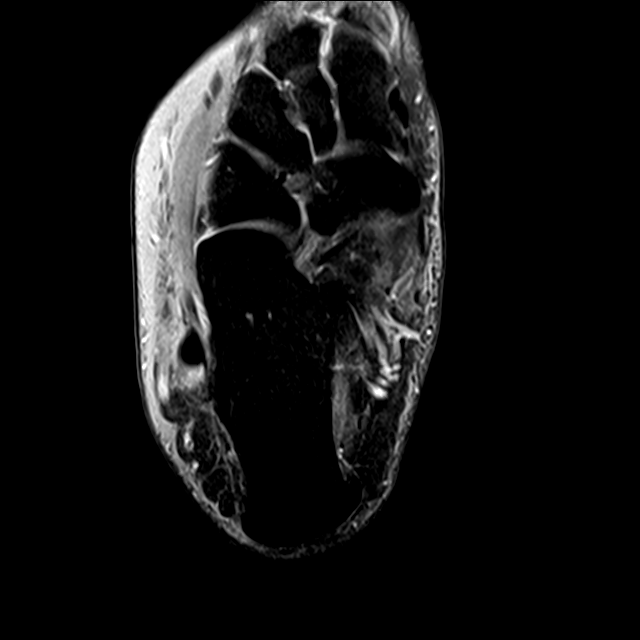
[im 25/29]
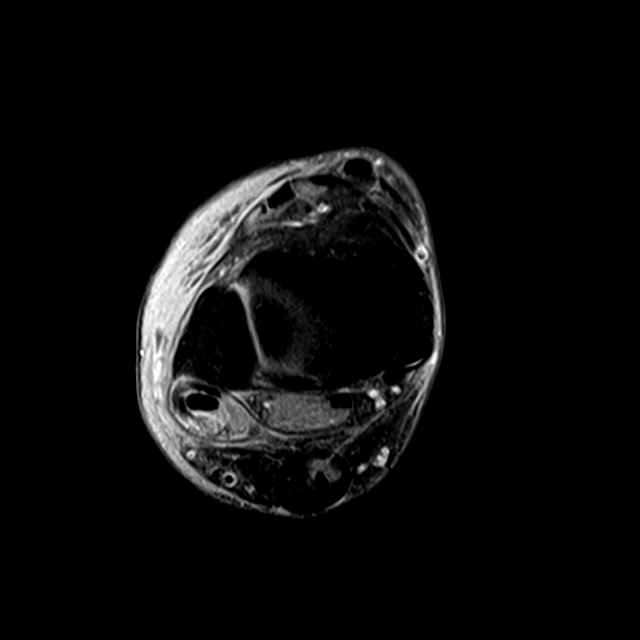

[Series 7: T2 fat-sat · sagittal · 3.5mm · 0.25mm/px · 3 of 22 slices shown (2 of 3)]
[im 4/22]
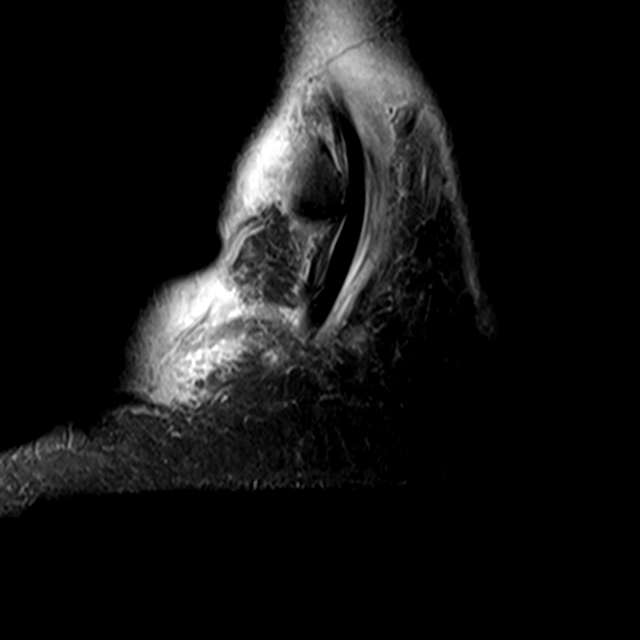
[im 11/22]
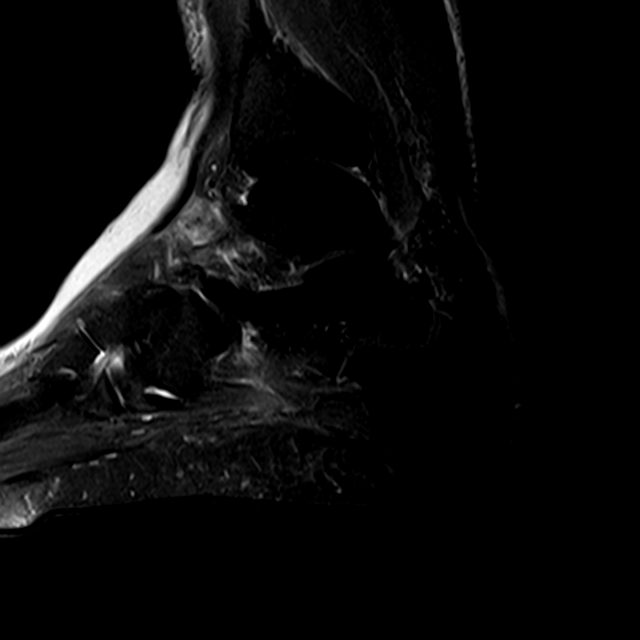
[im 18/22]
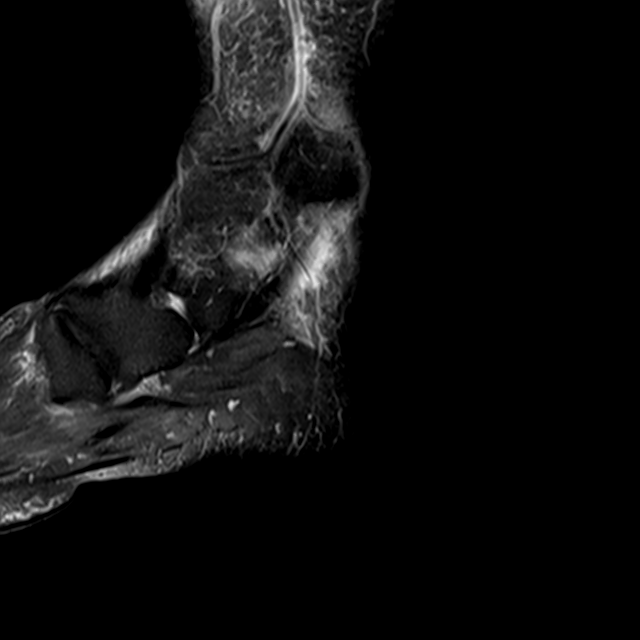

[Series 8: T2 fat-sat · coronal · 3.3mm · 0.23mm/px · 3 of 30 slices shown (3 of 3)]
[im 4/30]
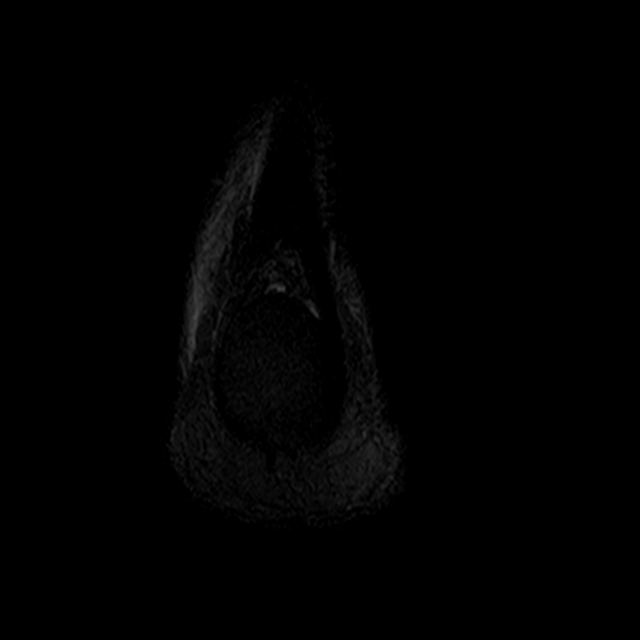
[im 15/30]
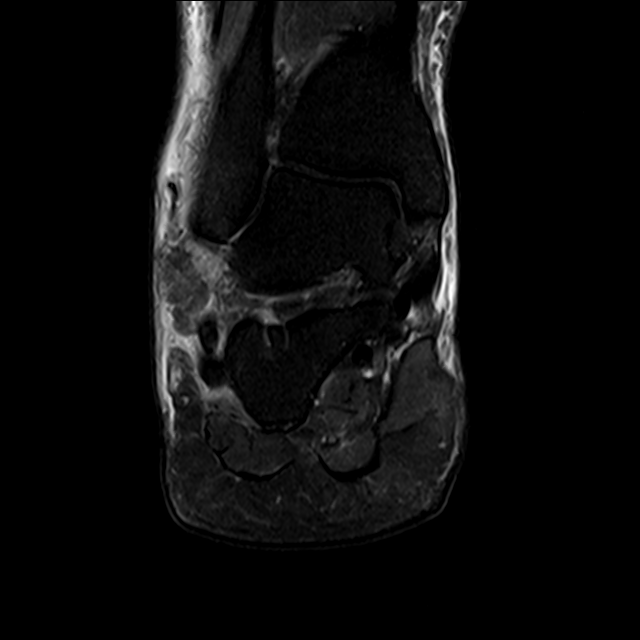
[im 26/30]
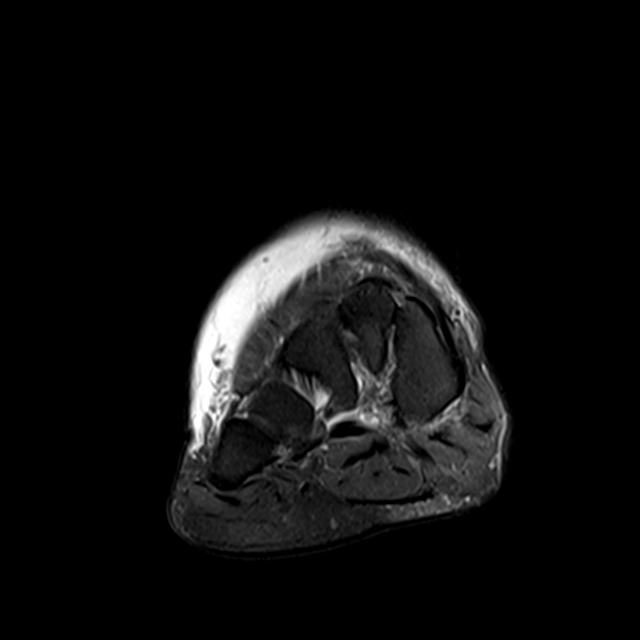

[13 of 40 positions shown; findings below may reference images not displayed]

FINDINGS: TENDONS

Peroneal: There is a split tear of the peroneus brevis tendon. There
is mild-to-moderate thickening of the peroneus longus tendon. Mild
peroneal tenosynovitis.

Posteromedial: Intact tibialis posterior, flexor hallucis longus and
flexor digitorum longus tendons. Minimal posterior tibialis
tenosynovitis.

Anterior: Intact tibialis anterior, extensor hallucis longus and
extensor digitorum longus tendons.

Achilles: Intact.

Plantar Fascia: Intact.

LIGAMENTS

Lateral: The anterior talofibular ligament is attenuated. The
remaining lateral ankle ligaments are intact.

Medial: Intact.

CARTILAGE

Ankle Joint: Trace ankle joint effusion. No chondral defect. The
talar dome and tibial plafond are intact.

Subtalar Joints/Sinus Tarsi: Unremarkable.

Bones: No marrow signal abnormality. No fracture or dislocation.

Other: Prominent soft tissue swelling along the lateral ankle and
lateral dorsal foot.
IMPRESSION: 1. Peroneal tenosynovitis with split tear of the peroneus brevis and
moderate peroneus longus tendinosis.
2. Attenuation of the anterior talofibular ligament, likely due to
prior partial tearing.
3. Nonspecific prominent soft tissue swelling along the lateral
ankle and dorsal foot. Correlate for cellulitis.

## 2018-09-20 DIAGNOSIS — E039 Hypothyroidism, unspecified: Secondary | ICD-10-CM | POA: Diagnosis not present

## 2018-09-20 DIAGNOSIS — R7301 Impaired fasting glucose: Secondary | ICD-10-CM | POA: Diagnosis not present

## 2018-09-20 DIAGNOSIS — Z1331 Encounter for screening for depression: Secondary | ICD-10-CM | POA: Diagnosis not present

## 2018-09-20 DIAGNOSIS — E785 Hyperlipidemia, unspecified: Secondary | ICD-10-CM | POA: Diagnosis not present

## 2018-09-20 DIAGNOSIS — D126 Benign neoplasm of colon, unspecified: Secondary | ICD-10-CM | POA: Diagnosis not present

## 2019-01-14 ENCOUNTER — Ambulatory Visit: Payer: BC Managed Care – PPO | Admitting: Certified Nurse Midwife

## 2019-02-03 ENCOUNTER — Other Ambulatory Visit: Payer: Self-pay

## 2019-02-07 ENCOUNTER — Other Ambulatory Visit (HOSPITAL_COMMUNITY)
Admission: RE | Admit: 2019-02-07 | Discharge: 2019-02-07 | Disposition: A | Discharge status: Home / Self Care | Payer: BC Managed Care – PPO | Source: Ambulatory Visit | Attending: Certified Nurse Midwife | Admitting: Certified Nurse Midwife

## 2019-02-07 ENCOUNTER — Encounter: Payer: Self-pay | Admitting: Certified Nurse Midwife

## 2019-02-07 ENCOUNTER — Ambulatory Visit (INDEPENDENT_AMBULATORY_CARE_PROVIDER_SITE_OTHER): Payer: BC Managed Care – PPO | Admitting: Certified Nurse Midwife

## 2019-02-07 ENCOUNTER — Other Ambulatory Visit: Payer: Self-pay

## 2019-02-07 VITALS — BP 110/72 | HR 68 | Temp 97.1°F | Resp 16 | Ht 59.75 in | Wt 151.0 lb

## 2019-02-07 DIAGNOSIS — Z124 Encounter for screening for malignant neoplasm of cervix: Secondary | ICD-10-CM | POA: Diagnosis not present

## 2019-02-07 DIAGNOSIS — Z23 Encounter for immunization: Secondary | ICD-10-CM | POA: Diagnosis not present

## 2019-02-07 DIAGNOSIS — Z01419 Encounter for gynecological examination (general) (routine) without abnormal findings: Secondary | ICD-10-CM | POA: Diagnosis not present

## 2019-02-07 NOTE — Patient Instructions (Signed)

## 2019-02-07 NOTE — Progress Notes (Signed)
64 y.o. G65P0040 Married  Caucasian Fe here for annual exam. Menopausal no HRT. Denies vaginal bleeding or vaginal dryness. Has lost weight 18 pounds on Keto diet. She has cut out all carbohydrates and feels much better. No HSV outbreaks in a Patil time. Dr. Forde Dandy manages Lexapro, Crestor, hypothyroid, Vitamin D. Sister died in past year, emotionally doing OK. No other health issues today.  Patient's last menstrual period was 01/28/2011 (approximate).          Sexually active: No.  The current method of family planning is post menopausal status.    Exercising: Yes.    eliptical Smoker:  no  Review of Systems  Constitutional: Negative.   HENT: Negative.   Eyes: Negative.   Respiratory: Negative.   Cardiovascular: Negative.   Gastrointestinal: Negative.   Genitourinary: Negative.   Musculoskeletal: Negative.   Skin: Negative.   Neurological: Negative.   Endo/Heme/Allergies: Negative.   Psychiatric/Behavioral: Negative.     Health Maintenance: Pap:  01-06-17 neg HPV HR neg History of Abnormal Pap: no MMG:  05-22-17 category c density birads 2:neg, done in 2020 neg per patient waiting on fax Self Breast exams: yes Colonoscopy:  2016 f/u 48yrs BMD:  none TDaP:  2011 Shingles: no Pneumonia: no Hep C and HIV: HIV neg 2008, Hep c neg 2016 Labs: PCP   reports that she has quit smoking. She has a 1.25 pack-year smoking history. She has never used smokeless tobacco. She reports current alcohol use of about 5.0 standard drinks of alcohol per week. She reports that she does not use drugs.  Past Medical History:  Diagnosis Date  . Bulging disc    lower back   . Depression    situational  . Graves disease 9/96   hx of RAI  . HSV infection   . Hypothyroidism   . Thyroid disease   . Vertigo     Past Surgical History:  Procedure Laterality Date  . COLONOSCOPY W/ BIOPSIES  02/22/2009   tubular adenoma  . LAPAROSCOPIC APPENDECTOMY  10/03/2011   Procedure: APPENDECTOMY LAPAROSCOPIC;   Surgeon: Adin Hector, MD;  Location: [facility];  Service: General;  Laterality: N/A;    Current Outpatient Medications  Medication Sig Dispense Refill  . DULoxetine (CYMBALTA) 60 MG capsule Take 60 mg by mouth daily.    Marland Kitchen escitalopram (LEXAPRO) 10 MG tablet Take 1 tablet by mouth daily.    . meclizine (ANTIVERT) 25 MG tablet Take 25 mg by mouth 3 (three) times daily as needed for dizziness.    . rosuvastatin (CRESTOR) 10 MG tablet Take 10 mg by mouth. 3 times a week    . SYNTHROID 112 MCG tablet Take 1 tablet by mouth daily.    . valACYclovir (VALTREX) 1000 MG tablet Take 1 tablet (1,000 mg total) by mouth daily. 90 tablet 3  . Vitamin D, Ergocalciferol, (DRISDOL) 50000 units CAPS capsule Take 1 capsule by mouth once a week.     No current facility-administered medications for this visit.    Family History  Problem Relation Age of Onset  . Breast cancer Mother 63       breast - died age 70  . Hyperlipidemia Father   . Hyperlipidemia Sister   . Diabetes Sister   . Breast cancer Sister 65       Lumpectomy and Radiation  . Diabetes Brother   . Diabetes Sister     ROS:  Pertinent items are noted in HPI.  Otherwise, a comprehensive ROS was negative.  Exam:   LMP 01/28/2011 (Approximate) Comment: postmenopausal   Ht Readings from Last 3 Encounters:  01/12/18 4' 11.75" (1.518 m)  04/01/17 5' (1.524 m)  02/24/17 5' (1.524 m)    General appearance: alert, cooperative and appears stated age Head: Normocephalic, without obvious abnormality, atraumatic Neck: no adenopathy, supple, symmetrical, trachea midline and thyroid normal to inspection and palpation Lungs: clear to auscultation bilaterally Breasts: normal appearance, no masses or tenderness, No nipple retraction or dimpling, No nipple discharge or bleeding, No axillary or supraclavicular adenopathy Heart: regular rate and rhythm Abdomen: soft, non-tender; no masses,  no organomegaly Extremities: extremities normal,  atraumatic, no cyanosis or edema Skin: Skin color, texture, turgor normal. No rashes or lesions Lymph nodes: Cervical, supraclavicular, and axillary nodes normal. No abnormal inguinal nodes palpated Neurologic: Grossly normal   Pelvic: External genitalia:  no lesions              Urethra:  normal appearing urethra with no masses, tenderness or lesions              Bartholin's and Skene's: normal                 Vagina: normal appearing vagina with normal color and discharge, no lesions              Cervix: no bleeding following Pap, no cervical motion tenderness, no lesions and normal appearance              Pap taken: Yes.   Bimanual Exam:  Uterus:  normal size, contour, position, consistency, mobility, non-tender and anteverted              Adnexa: normal adnexa and no mass, fullness, tenderness               Rectovaginal: Confirms               Anus:  normal sphincter tone, no lesions  Chaperone present: yes  A:  Well Woman with normal exam  Menopausal no HRT  Lexapro, Crestor, Hypothyroid with PCP management  History of HSV, no update needed  Social stress with death of sister  BMD due  Immunization update  P:   Reviewed health and wellness pertinent to exam  Aware of need to advise if vaginal bleeding  Continue follow up with PCP as indicated  Will call if needs RX.  Seek family and friend support as needed  Patient will have done at PCP([facility])  Requests TDAP  Pap smear: yes   counseled on breast self exam, mammography screening, feminine hygiene, menopause, adequate intake of calcium and vitamin D, diet and exercise  return annually or prn  An After Visit Summary was printed and given to the patient.

## 2019-02-08 LAB — CYTOLOGY - PAP: Diagnosis: NEGATIVE

## 2019-02-10 DIAGNOSIS — H2513 Age-related nuclear cataract, bilateral: Secondary | ICD-10-CM | POA: Diagnosis not present

## 2019-02-22 DIAGNOSIS — E559 Vitamin D deficiency, unspecified: Secondary | ICD-10-CM | POA: Diagnosis not present

## 2019-02-22 DIAGNOSIS — Z0000 Encounter for general adult medical examination without abnormal findings: Secondary | ICD-10-CM | POA: Diagnosis not present

## 2019-02-22 DIAGNOSIS — E039 Hypothyroidism, unspecified: Secondary | ICD-10-CM | POA: Diagnosis not present

## 2019-02-22 DIAGNOSIS — E7849 Other hyperlipidemia: Secondary | ICD-10-CM | POA: Diagnosis not present

## 2019-02-25 DIAGNOSIS — R82998 Other abnormal findings in urine: Secondary | ICD-10-CM | POA: Diagnosis not present

## 2019-03-01 DIAGNOSIS — E039 Hypothyroidism, unspecified: Secondary | ICD-10-CM | POA: Diagnosis not present

## 2019-03-01 DIAGNOSIS — E785 Hyperlipidemia, unspecified: Secondary | ICD-10-CM | POA: Diagnosis not present

## 2019-03-01 DIAGNOSIS — F329 Major depressive disorder, single episode, unspecified: Secondary | ICD-10-CM | POA: Diagnosis not present

## 2019-03-01 DIAGNOSIS — Z0000 Encounter for general adult medical examination without abnormal findings: Secondary | ICD-10-CM | POA: Diagnosis not present

## 2019-03-01 DIAGNOSIS — R42 Dizziness and giddiness: Secondary | ICD-10-CM | POA: Diagnosis not present

## 2019-03-02 DIAGNOSIS — H2513 Age-related nuclear cataract, bilateral: Secondary | ICD-10-CM | POA: Diagnosis not present

## 2019-03-07 DIAGNOSIS — Z1212 Encounter for screening for malignant neoplasm of rectum: Secondary | ICD-10-CM | POA: Diagnosis not present

## 2019-03-16 DIAGNOSIS — H2512 Age-related nuclear cataract, left eye: Secondary | ICD-10-CM | POA: Diagnosis not present

## 2019-03-16 DIAGNOSIS — H2511 Age-related nuclear cataract, right eye: Secondary | ICD-10-CM | POA: Diagnosis not present

## 2019-03-25 DIAGNOSIS — F329 Major depressive disorder, single episode, unspecified: Secondary | ICD-10-CM | POA: Diagnosis not present

## 2019-03-25 DIAGNOSIS — Z1331 Encounter for screening for depression: Secondary | ICD-10-CM | POA: Diagnosis not present

## 2019-03-30 DIAGNOSIS — H2512 Age-related nuclear cataract, left eye: Secondary | ICD-10-CM | POA: Diagnosis not present

## 2019-04-19 ENCOUNTER — Encounter: Payer: Self-pay | Admitting: Certified Nurse Midwife

## 2019-05-11 DIAGNOSIS — L820 Inflamed seborrheic keratosis: Secondary | ICD-10-CM | POA: Diagnosis not present

## 2019-05-11 DIAGNOSIS — L821 Other seborrheic keratosis: Secondary | ICD-10-CM | POA: Diagnosis not present

## 2019-05-11 DIAGNOSIS — L7211 Pilar cyst: Secondary | ICD-10-CM | POA: Diagnosis not present

## 2019-06-07 DIAGNOSIS — L7211 Pilar cyst: Secondary | ICD-10-CM | POA: Diagnosis not present

## 2019-06-15 DIAGNOSIS — L7211 Pilar cyst: Secondary | ICD-10-CM | POA: Diagnosis not present

## 2019-06-23 DIAGNOSIS — Z4802 Encounter for removal of sutures: Secondary | ICD-10-CM | POA: Diagnosis not present

## 2019-07-18 ENCOUNTER — Encounter: Payer: Self-pay | Admitting: Obstetrics and Gynecology

## 2019-07-18 DIAGNOSIS — Z1231 Encounter for screening mammogram for malignant neoplasm of breast: Secondary | ICD-10-CM | POA: Diagnosis not present

## 2019-08-11 DIAGNOSIS — R194 Change in bowel habit: Secondary | ICD-10-CM | POA: Diagnosis not present

## 2019-08-11 DIAGNOSIS — Z1211 Encounter for screening for malignant neoplasm of colon: Secondary | ICD-10-CM | POA: Diagnosis not present

## 2019-08-11 DIAGNOSIS — Z800 Family history of malignant neoplasm of digestive organs: Secondary | ICD-10-CM | POA: Diagnosis not present

## 2019-08-11 DIAGNOSIS — Z86010 Personal history of colonic polyps: Secondary | ICD-10-CM | POA: Diagnosis not present

## 2019-08-29 DIAGNOSIS — Z800 Family history of malignant neoplasm of digestive organs: Secondary | ICD-10-CM | POA: Diagnosis not present

## 2019-08-29 DIAGNOSIS — Z1211 Encounter for screening for malignant neoplasm of colon: Secondary | ICD-10-CM | POA: Diagnosis not present

## 2019-09-06 DIAGNOSIS — R7301 Impaired fasting glucose: Secondary | ICD-10-CM | POA: Diagnosis not present

## 2019-09-06 DIAGNOSIS — E039 Hypothyroidism, unspecified: Secondary | ICD-10-CM | POA: Diagnosis not present

## 2019-10-28 HISTORY — PX: KIDNEY DONATION: SHX685

## 2019-11-12 DIAGNOSIS — Z20822 Contact with and (suspected) exposure to covid-19: Secondary | ICD-10-CM | POA: Diagnosis not present

## 2019-12-08 DIAGNOSIS — Z961 Presence of intraocular lens: Secondary | ICD-10-CM | POA: Diagnosis not present

## 2019-12-21 DIAGNOSIS — H26491 Other secondary cataract, right eye: Secondary | ICD-10-CM | POA: Diagnosis not present

## 2019-12-28 DIAGNOSIS — H26492 Other secondary cataract, left eye: Secondary | ICD-10-CM | POA: Diagnosis not present

## 2020-02-08 ENCOUNTER — Ambulatory Visit: Payer: BC Managed Care – PPO | Admitting: Certified Nurse Midwife

## 2020-02-09 ENCOUNTER — Encounter: Payer: Self-pay | Admitting: Obstetrics and Gynecology

## 2020-02-09 ENCOUNTER — Ambulatory Visit: Payer: BC Managed Care – PPO | Admitting: Obstetrics and Gynecology

## 2020-02-09 ENCOUNTER — Other Ambulatory Visit: Payer: Self-pay

## 2020-02-09 VITALS — BP 110/70 | HR 84 | Ht 60.0 in | Wt 142.0 lb

## 2020-02-09 DIAGNOSIS — Z01419 Encounter for gynecological examination (general) (routine) without abnormal findings: Secondary | ICD-10-CM

## 2020-02-09 DIAGNOSIS — Z860100 Personal history of colon polyps, unspecified: Secondary | ICD-10-CM | POA: Insufficient documentation

## 2020-02-09 DIAGNOSIS — K219 Gastro-esophageal reflux disease without esophagitis: Secondary | ICD-10-CM | POA: Insufficient documentation

## 2020-02-09 DIAGNOSIS — Z800 Family history of malignant neoplasm of digestive organs: Secondary | ICD-10-CM | POA: Insufficient documentation

## 2020-02-09 DIAGNOSIS — Z1211 Encounter for screening for malignant neoplasm of colon: Secondary | ICD-10-CM | POA: Insufficient documentation

## 2020-02-09 DIAGNOSIS — K5900 Constipation, unspecified: Secondary | ICD-10-CM | POA: Insufficient documentation

## 2020-02-09 DIAGNOSIS — Z803 Family history of malignant neoplasm of breast: Secondary | ICD-10-CM | POA: Diagnosis not present

## 2020-02-09 DIAGNOSIS — K625 Hemorrhage of anus and rectum: Secondary | ICD-10-CM | POA: Insufficient documentation

## 2020-02-09 MED ORDER — VALACYCLOVIR HCL 500 MG PO TABS: ORAL_TABLET | ORAL | 3 refills | Status: DC

## 2020-02-09 NOTE — Progress Notes (Signed)
65 y.o. G71P0040 Married White or Caucasian Not Hispanic or Latino female here for annual exam. patient states that she will need a prescription for Valtrex sent to [facility]. She takes valtrex prn for genital hsv. Recently having more outbreaks with increase in stress. Having about 4 outbreaks a year.   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   No vaginal bleeding. Not sexually active, husband has ED.     Patient's last menstrual period was 01/28/2011 (approximate).          Sexually active: No.  The current method of family planning is post menopausal status.    Exercising: No.  The patient does not participate in regular exercise at present. Smoker:  no  Health Maintenance: Pap:  02/07/19 Neg  01/06/17 Neg:Neg HR HPV History of abnormal Pap:  no MMG:  2021 -- [facility] to fax report BMD:   none Colonoscopy: July 2021 normal f/u 5 years TDaP:  02/07/19 Gardasil: n/a   reports that she has quit smoking. She has a 1.25 pack-year smoking history. She has never used smokeless tobacco. She reports current alcohol use of about 3.0 standard drinks of alcohol per week. She reports that she does not use drugs. 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 Currently working from home.   Past Medical History:  Diagnosis Date  . Bulging disc    lower back   . Depression    situational  . Graves disease 9/96   hx of RAI  . HSV infection   . Hypothyroidism   . Thyroid disease   . Vertigo     Past Surgical History:  Procedure Laterality Date  . CATARACT EXTRACTION Bilateral   . CATARACT EXTRACTION W/ INTRAOCULAR LENS IMPLANT Bilateral   . COLONOSCOPY W/ BIOPSIES  02/22/2009   tubular adenoma  . KIDNEY DONATION    . LAPAROSCOPIC APPENDECTOMY  10/03/2011   Procedure: APPENDECTOMY LAPAROSCOPIC;  Surgeon: Ernestene Mention, MD;  Location: [facility];  Service: General;  Laterality: N/A;    Current Outpatient Medications  Medication Sig  Dispense Refill  . acetaminophen (TYLENOL) 500 MG tablet Take 2 tablets by mouth three times a day for 5 days, then take 1-2 tablets three times a day as needed for pain    . Biotin 10 MG CAPS Take 1 capsule by mouth daily.    . cycloSPORINE (RESTASIS) 0.05 % ophthalmic emulsion Place 1 drop into both eyes 2 times daily.    . DULoxetine (CYMBALTA) 30 MG capsule Take 2 capsules by mouth daily.    . Magnesium Gluconate 550 MG TABS Take by mouth.    . rosuvastatin (CRESTOR) 10 MG tablet Take 10 mg by mouth. 3 times a week    . SYNTHROID 112 MCG tablet Take 1 tablet by mouth daily.    . valACYclovir (VALTREX) 1000 MG tablet Take 1 tablet (1,000 mg total) by mouth daily. 90 tablet 3  . Vitamin D, Ergocalciferol, (DRISDOL) 50000 units CAPS capsule Take 1 capsule by mouth once a week.     No current facility-administered medications for this visit.    Family History  Problem Relation Age of Onset  . Breast cancer Mother 68       breast - died age 57  . Hyperlipidemia Father   . Hyperlipidemia Sister   . Diabetes Sister   . Breast cancer Sister 60       Lumpectomy and Radiation  . Diabetes Brother   .  Diabetes Sister   . Diabetes Sister   . Kidney failure Sister   . Muscular dystrophy Sister   Other sister may have died from colon cancer.   Review of Systems  Constitutional: Negative.   HENT: Negative.   Eyes: Negative.   Respiratory: Negative.   Cardiovascular: Negative.   Gastrointestinal: Negative.   Endocrine: Negative.   Genitourinary: Negative.   Musculoskeletal: Negative.   Skin: Negative.   Allergic/Immunologic: Negative.   Neurological: Negative.   Hematological: Negative.   Psychiatric/Behavioral: Negative.     Exam:   BP 110/70 (BP Location: Right Arm, Patient Position: Sitting, Cuff Size: Normal)   Pulse 84   Ht 5' (1.524 m)   Wt 142 lb (64.4 kg)   LMP 01/28/2011 (Approximate) Comment: postmenopausal  BMI 27.73 kg/m   Weight change: @WEIGHTCHANGE @ Height:    Height: 5' (152.4 cm)  Ht Readings from Last 3 Encounters:  02/09/20 5' (1.524 m)  02/07/19 4' 11.75" (1.518 m)  01/12/18 4' 11.75" (1.518 m)    General appearance: alert, cooperative and appears stated age Head: Normocephalic, without obvious abnormality, atraumatic Neck: no adenopathy, supple, symmetrical, trachea midline and thyroid normal to inspection and palpation Lungs: clear to auscultation bilaterally Cardiovascular: regular rate and rhythm Breasts: normal appearance, no masses or tenderness Abdomen: soft, non-tender; non distended,  no masses,  no organomegaly Extremities: extremities normal, atraumatic, no cyanosis or edema Skin: Skin color, texture, turgor normal. No rashes or lesions Lymph nodes: Cervical, supraclavicular, and axillary nodes normal. No abnormal inguinal nodes palpated Neurologic: Grossly normal   Pelvic: External genitalia:  no lesions              Urethra:  normal appearing urethra with no masses, tenderness or lesions              Bartholins and Skenes: normal                 Vagina: atrophic appearing vagina with normal color and discharge, no lesions              Cervix: no lesions               Bimanual Exam:  Uterus:  normal size, contour, position, consistency, mobility, non-tender              Adnexa: no mass, fullness, tenderness               Rectovaginal: Confirms               Anus:  normal sphincter tone, no lesions  06-04-1999 chaperoned for the exam.  1. Well woman exam Discussed breast self exam Discussed calcium and vit D intake Pap UTD Labs with primary Colonoscopy UTD  2. Family history of breast cancer  - Ambulatory referral to [facility]

## 2020-02-13 ENCOUNTER — Telehealth: Payer: Self-pay | Admitting: Obstetrics and Gynecology

## 2020-02-13 NOTE — Telephone Encounter (Signed)
Please let the patient know that I got a copy of her mammogram from [place]. Her mammogram was negative. They calculated her risk of breast cancer to be between 22.6% Ronelle Nigh) and 31.3% Tobey Bride Cuzick BRCA tool). This qualifies her for yearly MRI's. I would recommend that she do this 6 months after her mammogram so that she is getting imaging every 6 months. Given that her imaging isn't due until June, she could do the MRI now. Please see what she wants to do. At the time of her visit I placed a referral to Genetics, it's up to her if she wants to go to [facility]. Given that we have her risk of breast cancer they would discuss genetic testing options with her.

## 2020-02-15 NOTE — Telephone Encounter (Signed)
Spoke with patient and read her Dr. Salli Quarry message.  Patient declines MRI right now. She wants to go to [facility] appt and screening mammo in June and then decide what she will do about MRI.  Staff message sent to Victorino Dike to make sure Genetic Counseling appt is in the works.

## 2020-03-06 ENCOUNTER — Inpatient Hospital Stay: Payer: BC Managed Care – PPO

## 2020-03-06 ENCOUNTER — Inpatient Hospital Stay: Payer: BC Managed Care – PPO | Attending: Oncology | Admitting: Licensed Clinical Social Worker

## 2020-03-06 ENCOUNTER — Encounter: Payer: Self-pay | Admitting: Licensed Clinical Social Worker

## 2020-03-06 DIAGNOSIS — Z1379 Encounter for other screening for genetic and chromosomal anomalies: Secondary | ICD-10-CM

## 2020-03-06 DIAGNOSIS — Z808 Family history of malignant neoplasm of other organs or systems: Secondary | ICD-10-CM | POA: Insufficient documentation

## 2020-03-06 DIAGNOSIS — Z803 Family history of malignant neoplasm of breast: Secondary | ICD-10-CM

## 2020-03-06 NOTE — Progress Notes (Signed)
REFERRING PROVIDER: Salvadore Dom, [address] [place],  [place] [address]  PRIMARY PROVIDER:  Reynold Bowen, MD  PRIMARY REASON FOR VISIT:  1. Family history of breast cancer   2. Family history of melanoma      HISTORY OF PRESENT ILLNESS:   [name], a 65 y.o. female, was seen for a [place] cancer genetics consultation at the request of Dr. Talbert Nan due to a family history of breast cancer.  Kimberly Dorsey presents to clinic today to discuss the possibility of a hereditary predisposition to cancer, genetic testing, and to further clarify her future cancer risks, as well as potential cancer risks for family members.    Kimberly Dorsey is a 65 y.o. female with no personal history of cancer.    CANCER HISTORY:  Oncology History   No history exists.     RISK FACTORS:  Menarche was at age 58. Marland Kitchen  OCP use for approximately 10 years.  Ovaries intact: yes.  Hysterectomy: no.  Menopausal status: postmenopausal.  HRT use: 0 years. Colonoscopy: yes; normal. Mammogram within the last year: yes. Number of breast biopsies: 0. Up to date with pelvic exams: yes. Any excessive radiation exposure in the past: no  Past Medical History:  Diagnosis Date  . Bulging disc    lower back   . Depression    situational  . Family history of breast cancer   . Family history of melanoma   . Graves disease 9/96   hx of RAI  . HSV infection   . Hypothyroidism   . Thyroid disease   . Vertigo     Past Surgical History:  Procedure Laterality Date  . CATARACT EXTRACTION Bilateral   . CATARACT EXTRACTION W/ INTRAOCULAR LENS IMPLANT Bilateral   . COLONOSCOPY W/ BIOPSIES  02/22/2009   tubular adenoma  . KIDNEY DONATION    . LAPAROSCOPIC APPENDECTOMY  10/03/2011   Procedure: APPENDECTOMY LAPAROSCOPIC;  Surgeon: Adin Hector, MD;  Location: [facility];  Service: General;  Laterality: N/A;    Social History   Socioeconomic History  . Marital status: Married    Spouse name: Not on  file  . Number of children: 0  . Years of education: Not on file  . Highest education level: Not on file  Occupational History    Employer: [employer]  Tobacco Use  . Smoking status: Former Smoker    Packs/day: 0.25    Years: 5.00    Pack years: 1.25  . Smokeless tobacco: Never Used  Vaping Use  . Vaping Use: Never used  Substance and Sexual Activity  . Alcohol use: Yes    Alcohol/week: 3.0 standard drinks    Types: 3 Standard drinks or equivalent per week  . Drug use: No  . Sexual activity: Not Currently    Partners: Male    Birth control/protection: Post-menopausal  Other Topics Concern  . Not on file  Social History Narrative  . Not on file   Social Determinants of Health   Financial Resource Strain: Not on file  Food Insecurity: Not on file  Transportation Needs: Not on file  Physical Activity: Not on file  Stress: Not on file  Social Connections: Not on file     FAMILY HISTORY:  We obtained a detailed, 4-generation family history.  Significant diagnoses are listed below: Family History  Problem Relation Age of Onset  . Breast cancer Mother 54       breast - died age 104  .  Hyperlipidemia Father   . Hyperlipidemia Sister   . Diabetes Sister   . Breast cancer Sister 20       Lumpectomy and Radiation  . Diabetes Brother   . Diabetes Sister   . Diabetes Sister   . Kidney failure Sister   . Muscular dystrophy Sister   . Breast cancer Other        dx 43s   Kimberly Dorsey has 2 brothers, 4 sisters and a paternal half sister. One of her sisters had breast cancer in her 49s and had reportedly negative genetic testing at the time, which would have been about 10 years ago. Another sister had history of melanoma. Another sister's daughter had breast cancer in her 56s.   Kimberly Dorsey mother was diagnosed with breast cancer in her 51s and died at 76. Patient had 2 maternal aunts and 1 uncle, no known cancers although one of her aunts may have had breast cancer. No known cancers in  maternal cousins. Maternal grandmother died at 48, grandfather died young.   Kimberly Dorsey father died at 80. Patient had 1 paternal uncle, 1 paternal aunt, no cancers. No known cancers in cousins or grandparents.   Kimberly Dorsey is aware of previous family history of genetic testing for hereditary cancer risks. Patient's maternal ancestors are of unknown descent, and paternal ancestors are of unknown descent. There is no reported Ashkenazi Jewish ancestry. There is no known consanguinity.    GENETIC COUNSELING ASSESSMENT: Kimberly Dorsey is a 65 y.o. female with a family history of breast cancer which is somewhat suggestive of a hereditary cancer syndrome and predisposition to cancer. We, therefore, discussed and recommended the following at today's visit.   DISCUSSION: We discussed that approximately 5-10% of breast cancer is hereditary  Most cases of hereditary breast cancer are associated with BRCA1/BRCA2 genes, although there are other genes associated with hereditary breast cancer as well .  We discussed that testing is beneficial for several reasons including knowing about other cancer risks, identifying potential screening and risk-reduction options that may be appropriate, and to understand if other family members could be at risk for cancer and allow them to undergo genetic testing.   We reviewed the characteristics, features and inheritance patterns of hereditary cancer syndromes. We also discussed genetic testing, including the appropriate family members to test, the process of testing, insurance coverage and turn-around-time for results. We discussed the implications of a negative, positive and/or variant of uncertain significant result. We recommended [name] pursue genetic testing for the [facility] CancerNext-Expanded gene panel.   The CancerNext-Expanded + RNAinsight gene panel offered by [employer] and includes sequencing and rearrangement analysis for the following 77 genes: IP, ALK, APC*, ATM*,  AXIN2, BAP1, BARD1, BLM, BMPR1A, BRCA1*, BRCA2*, BRIP1*, CDC73, CDH1*,CDK4, CDKN1B, CDKN2A, CHEK2*, CTNNA1, DICER1, FANCC, FH, FLCN, GALNT12, KIF1B, LZTR1, MAX, MEN1, MET, MLH1*, MSH2*, MSH3, MSH6*, MUTYH*, NBN, NF1*, NF2, NTHL1, PALB2*, PHOX2B, PMS2*, POT1, PRKAR1A, PTCH1, PTEN*, RAD51C*, RAD51D*,RB1, RECQL, RET, SDHA, SDHAF2, SDHB, SDHC, SDHD, SMAD4, SMARCA4, SMARCB1, SMARCE1, STK11, SUFU, TMEM127, TP53*,TSC1, TSC2, VHL and XRCC2 (sequencing and deletion/duplication); EGFR, EGLN1, HOXB13, KIT, MITF, PDGFRA, POLD1 and POLE (sequencing only); EPCAM and GREM1 (deletion/duplication only). DNA and RNA analyses performed for * genes.  Based on Ms. Muilenburg's family history of cancer, she meets medical criteria for genetic testing. Despite that she meets criteria, she may still have an out of pocket cost. We discussed that if her out of pocket cost for testing is over $100, the laboratory Dorsey call  and confirm whether she wants to proceed with testing.  If the out of pocket cost of testing is less than $100 she Dorsey be billed by the genetic testing laboratory.   We discussed that some people do not want to undergo genetic testing due to fear of genetic discrimination.  A federal law called the Genetic Information Non-Discrimination Act (GINA) of 2008 helps protect individuals against genetic discrimination based on their genetic test results.  It impacts both health insurance and employment.  For health insurance, it protects against increased premiums, being kicked off insurance or being forced to take a test in order to be insured.  For employment it protects against hiring, firing and promoting decisions based on genetic test results.  Health status due to a cancer diagnosis is not protected under GINA.  This law does not protect life insurance, disability insurance, or other types of insurance.   PLAN: After considering the risks, benefits, and limitations, Ms. Rehm provided informed consent to pursue genetic  testing and the blood sample was sent to [facility] for analysis of the CancerNext-Expanded+RNA panel. Results should be available within approximately 2-3 weeks' time, at which point they Dorsey be disclosed by telephone to Ms. Lorson, as Dorsey any additional recommendations warranted by these results. Ms. Glore Dorsey receive a summary of her genetic counseling visit and a copy of her results once available. This information Dorsey also be available in Epic.   Ms. Laine questions were answered to her satisfaction today. Our contact information was provided should additional questions or concerns arise. Thank you for the referral and allowing [place] to share in the care of your patient.   Faith Rogue, MS, [facility] Genetic Counselor [email or web address] Phone: [phone]  The patient was seen for a total of 30 minutes in face-to-face genetic counseling.  Dr. Grayland Ormond was available for discussion regarding this case.   _______________________________________________________________________ For Office Staff:  Number of people involved in session: 1 Was an Intern/ student involved with case: no

## 2020-03-08 DIAGNOSIS — E785 Hyperlipidemia, unspecified: Secondary | ICD-10-CM | POA: Diagnosis not present

## 2020-03-08 DIAGNOSIS — R7301 Impaired fasting glucose: Secondary | ICD-10-CM | POA: Diagnosis not present

## 2020-03-08 DIAGNOSIS — E559 Vitamin D deficiency, unspecified: Secondary | ICD-10-CM | POA: Diagnosis not present

## 2020-03-08 DIAGNOSIS — E039 Hypothyroidism, unspecified: Secondary | ICD-10-CM | POA: Diagnosis not present

## 2020-03-15 ENCOUNTER — Encounter: Payer: Self-pay | Admitting: Licensed Clinical Social Worker

## 2020-03-15 DIAGNOSIS — Z1379 Encounter for other screening for genetic and chromosomal anomalies: Secondary | ICD-10-CM | POA: Insufficient documentation

## 2020-03-15 NOTE — Progress Notes (Signed)
Update 03/15/2020:  Patient cancelled testing due to cost; she was offered OOP cost of $250. Insurance covered but had higher cost due to deductible.   Since she did not have testing, we ran a risk model. Based on the patient's family history, a statistical model Air cabin crew) was used to estimate her risk of developing breast cancer. This estimates her lifetime risk of developing breast cancer to be approximately 30%. This estimation does not consider any genetic testing results.  The patient's lifetime breast cancer risk is a preliminary estimate based on available information using one of several models endorsed by the American Cancer Society (ACS). The ACS recommends consideration of breast MRI screening as an adjunct to mammography for patients at high risk (defined as 20% or greater lifetime risk).    Ms. Newlun has been determined to be at high risk for breast cancer.  Therefore, we recommend that annual screening with mammography and breast MRI be performed.  We offered referral to our high risk clinic, but patient declined at this time due to cost. We discussed that Ms. Martucci should discuss her individual situation with her referring physician and determine a breast cancer screening plan with which they are both comfortable.  Patient knows to contact me if she changes her mind about testing or referral.   Lacy Duverney, MS, [facility] Genetic Counselor [email or web address] Phone: [phone]

## 2020-07-24 DIAGNOSIS — Z1231 Encounter for screening mammogram for malignant neoplasm of breast: Secondary | ICD-10-CM | POA: Diagnosis not present

## 2020-08-09 DIAGNOSIS — J209 Acute bronchitis, unspecified: Secondary | ICD-10-CM | POA: Diagnosis not present

## 2020-08-09 DIAGNOSIS — R5383 Other fatigue: Secondary | ICD-10-CM | POA: Diagnosis not present

## 2020-08-09 DIAGNOSIS — R059 Cough, unspecified: Secondary | ICD-10-CM | POA: Diagnosis not present

## 2020-09-13 DIAGNOSIS — E039 Hypothyroidism, unspecified: Secondary | ICD-10-CM | POA: Diagnosis not present

## 2020-09-13 DIAGNOSIS — R7301 Impaired fasting glucose: Secondary | ICD-10-CM | POA: Diagnosis not present

## 2020-10-10 DIAGNOSIS — E785 Hyperlipidemia, unspecified: Secondary | ICD-10-CM | POA: Diagnosis not present

## 2020-12-12 ENCOUNTER — Other Ambulatory Visit: Payer: Self-pay | Admitting: Obstetrics and Gynecology

## 2020-12-12 NOTE — Telephone Encounter (Signed)
Last annual exam was on 02/09/20 Last mammogram scanned was done on 06/2019   I called patient because [facility] came from [facility] and I know they require 90 day supply. Patient reports she has been having more frequent outbreaks so she has been taking Rx daily.

## 2020-12-12 NOTE — Telephone Encounter (Signed)
90day supply sent.

## 2021-02-12 ENCOUNTER — Encounter: Payer: Self-pay | Admitting: Obstetrics and Gynecology

## 2021-02-12 NOTE — Progress Notes (Signed)
66 y.o. [id number] Married White or Caucasian Not Hispanic or Latino female here for annual exam.  No vaginal bleeding.  Not sexually active, fine with it.   She c/o mild GSI with full bladder. Small amount 1-2 x a day.  No urge incontinence.   No bowel c/o.  H/O HSV, on suppression for h/o frequent outbreaks.   Her brother had an accident before christmas and fell from is truck. He has a traumatic brain injury. Was told that he would never be functional, recommended they turn off life support. He is now talking and starting to move.    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 She is doing well.   Patient's last menstrual period was 01/28/2011 (approximate).          Sexually active: No.  The current method of family planning is post menopausal status.    Exercising: Yes.     Gym  Smoker:  no  Health Maintenance: Pap:  02/07/19 Neg             01/06/17 Neg:Neg HR HPV History of abnormal Pap:  no MMG: 07/24/20, benign. Declines Breast MRI, didn't do genetic testing.  BMD:   none  Colonoscopy: July 2021 normal f/u 5 years TDaP:  02/07/19 Gardasil: n/a   reports that she has quit smoking. She has a 1.25 pack-year smoking history. She has never used smokeless tobacco. She reports current alcohol use of about 3.0 standard drinks per week. She reports that she does not use drugs. ~10 drinks a week.  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 Mostly works from home. Plans to retire in 9/26. Husband is a Network engineer.   Past Medical History:  Diagnosis Date   Bulging disc    lower back    Depression    situational   Family history of breast cancer    Family history of melanoma    Graves disease 9/96   hx of RAI   HSV infection    Hypothyroidism    Thyroid disease    Vertigo     Past Surgical History:  Procedure Laterality Date   CATARACT EXTRACTION Bilateral    CATARACT EXTRACTION W/ INTRAOCULAR LENS IMPLANT Bilateral    COLONOSCOPY W/ BIOPSIES   02/22/2009   tubular adenoma   KIDNEY DONATION  10/28/2019   LAPAROSCOPIC APPENDECTOMY  10/03/2011   Procedure: APPENDECTOMY LAPAROSCOPIC;  Surgeon: Ernestene Mention, MD;  Location: [facility];  Service: General;  Laterality: N/A;    Current Outpatient Medications  Medication Sig Dispense Refill   acetaminophen (TYLENOL) 500 MG tablet Take 2 tablets by mouth three times a day for 5 days, then take 1-2 tablets three times a day as needed for pain     DULoxetine (CYMBALTA) 30 MG capsule Take 2 capsules by mouth daily.     Magnesium Gluconate 550 MG TABS Take by mouth.     rosuvastatin (CRESTOR) 10 MG tablet Take 10 mg by mouth. 3 times a week     SYNTHROID 112 MCG tablet Take 1 tablet by mouth daily.     valACYclovir (VALTREX) 500 MG tablet TAKE 1 TABLET DAILY, INCREASE TO ONE TABLET TWICE A DAY FOR THREE DAYS PRN OUTBREAK 90 tablet 1   Vitamin D, Ergocalciferol, (DRISDOL) 50000 units CAPS capsule Take 1 capsule by mouth once a week.     No current facility-administered medications for this visit.    Family History  Problem Relation Age of Onset  Breast cancer Mother 29       breast - died age 38   Hyperlipidemia Father    Hyperlipidemia Sister    Diabetes Sister    Breast cancer Sister 52       Lumpectomy and Radiation   Diabetes Brother    Diabetes Sister    Diabetes Sister    Kidney failure Sister    Muscular dystrophy Sister    Breast cancer Other        dx 8s    Review of Systems  All other systems reviewed and are negative.  Exam:   BP 124/60    Pulse 87    Ht 5' (1.524 m)    Wt 154 lb (69.9 kg)    LMP 01/28/2011 (Approximate) Comment: postmenopausal   SpO2 98%    BMI 30.08 kg/m   Weight change: @WEIGHTCHANGE @ Height:   Height: 5' (152.4 cm)  Ht Readings from Last 3 Encounters:  02/14/21 5' (1.524 m)  02/09/20 5' (1.524 m)  02/07/19 4' 11.75" (1.518 m)    General appearance: alert, cooperative and appears stated age Head: Normocephalic, without obvious  abnormality, atraumatic Neck: no adenopathy, supple, symmetrical, trachea midline and thyroid normal to inspection and palpation Lungs: clear to auscultation bilaterally Cardiovascular: regular rate and rhythm Breasts: normal appearance, no masses or tenderness Abdomen: soft, non-tender; non distended,  no masses,  no organomegaly Extremities: extremities normal, atraumatic, no cyanosis or edema Skin: Skin color, texture, turgor normal. No rashes or lesions Lymph nodes: Cervical, supraclavicular, and axillary nodes normal. No abnormal inguinal nodes palpated Neurologic: Grossly normal   Pelvic: External genitalia:  no lesions              Urethra:  normal appearing urethra with no masses, tenderness or lesions              Bartholins and Skenes: normal                 Vagina: mildly atrophic appearing vagina with normal color and discharge, no lesions              Cervix: no lesions               Bimanual Exam:  Uterus:  normal size, contour, position, consistency, mobility, non-tender              Adnexa: no mass, fullness, tenderness               Rectovaginal: Confirms               Anus:  normal sphincter tone, no lesions  06-04-1999 chaperoned for the exam.  1. Well woman exam Mammogram in 6/23 Colonoscopy UTD Discussed breast self exam Discussed calcium and vit D intake Pap next year  2. GSI (genuine stress incontinence), female Discussed kegels - Urinalysis,Complete w/RFL Culture  3. Family history of breast cancer Elevated risk of breast cancer. Getting yearly mammograms, declines MRI  4. History of herpes genitalis - valACYclovir (VALTREX) 500 MG tablet; TAKE 1 TABLET DAILY, INCREASE TO ONE TABLET TWICE A DAY FOR THREE DAYS PRN OUTBREAK  Dispense: 90 tablet; Refill: 3

## 2021-02-14 ENCOUNTER — Other Ambulatory Visit: Payer: Self-pay

## 2021-02-14 ENCOUNTER — Ambulatory Visit (INDEPENDENT_AMBULATORY_CARE_PROVIDER_SITE_OTHER): Payer: BC Managed Care – PPO | Admitting: Obstetrics and Gynecology

## 2021-02-14 ENCOUNTER — Encounter: Payer: Self-pay | Admitting: Obstetrics and Gynecology

## 2021-02-14 VITALS — BP 124/60 | HR 87 | Ht 60.0 in | Wt 154.0 lb

## 2021-02-14 DIAGNOSIS — Z803 Family history of malignant neoplasm of breast: Secondary | ICD-10-CM | POA: Diagnosis not present

## 2021-02-14 DIAGNOSIS — Z01419 Encounter for gynecological examination (general) (routine) without abnormal findings: Secondary | ICD-10-CM

## 2021-02-14 DIAGNOSIS — Z8619 Personal history of other infectious and parasitic diseases: Secondary | ICD-10-CM | POA: Diagnosis not present

## 2021-02-14 DIAGNOSIS — N393 Stress incontinence (female) (male): Secondary | ICD-10-CM

## 2021-02-14 MED ORDER — VALACYCLOVIR HCL 500 MG PO TABS: ORAL_TABLET | ORAL | 3 refills | Status: DC

## 2021-02-14 NOTE — Patient Instructions (Signed)
EXERCISE   We recommended that you start or continue a regular exercise program for good health. Physical activity is anything that gets your body moving, some is better than none. The CDC recommends 150 minutes per week of Moderate-Intensity Aerobic Activity and 2 or more days of Muscle Strengthening Activity.  Benefits of exercise are limitless: helps weight loss/weight maintenance, improves mood and energy, helps with depression and anxiety, improves sleep, tones and strengthens muscles, improves balance, improves bone density, protects from chronic conditions such as heart disease, high blood pressure and diabetes and so much more. To learn more visit: https://www.cdc.gov/physicalactivity/index.html  DIET: Good nutrition starts with a healthy diet of fruits, vegetables, whole grains, and lean protein sources. Drink plenty of water for hydration. Minimize empty calories, sodium, sweets. For more information about dietary recommendations visit: https://health.gov/our-work/nutrition-physical-activity/dietary-guidelines and https://www.myplate.gov/  ALCOHOL:  Women should limit their alcohol intake to no more than 7 drinks/beers/glasses of wine (combined, not each!) per week. Moderation of alcohol intake to this level decreases your risk of breast cancer and liver damage.  If you are concerned that you may have a problem, or your friends have told you they are concerned about your drinking, there are many resources to help. A well-known program that is free, effective, and available to all people all over the nation is Alcoholics Anonymous.  Check out this site to learn more: https://www.aa.org/   CALCIUM AND VITAMIN D:  Adequate intake of calcium and Vitamin D are recommended for bone health.  You should be getting between 1000-1200 mg of calcium and 800 units of Vitamin D daily between diet and supplements  PAP SMEARS:  Pap smears, to check for cervical cancer or precancers,  have traditionally been  done yearly, scientific advances have shown that most women can have pap smears less often.  However, every woman still should have a physical exam from her gynecologist every year. It will include a breast check, inspection of the vulva and vagina to check for abnormal growths or skin changes, a visual exam of the cervix, and then an exam to evaluate the size and shape of the uterus and ovaries. We will also provide age appropriate advice regarding health maintenance, like when you should have certain vaccines, screening for sexually transmitted diseases, bone density testing, colonoscopy, mammograms, etc.   MAMMOGRAMS:  All women over 40 years old should have a routine mammogram.   COLON CANCER SCREENING: Now recommend starting at age 45. At this time colonoscopy is not covered for routine screening until 50. There are take home tests that can be done between 45-49.   COLONOSCOPY:  Colonoscopy to screen for colon cancer is recommended for all women at age 50.  We know, you hate the idea of the prep.  We agree, BUT, having colon cancer and not knowing it is worse!!  Colon cancer so often starts as a polyp that can be seen and removed at colonscopy, which can quite literally save your life!  And if your first colonoscopy is normal and you have no family history of colon cancer, most women don't have to have it again for 10 years.  Once every ten years, you can do something that may end up saving your life, right?  We will be happy to help you get it scheduled when you are ready.  Be sure to check your insurance coverage so you understand how much it will cost.  It may be covered as a preventative service at no cost, but you should check   your particular policy.      Breast Self-Awareness Breast self-awareness means being familiar with how your breasts look and feel. It involves checking your breasts regularly and reporting any changes to your health care provider. Practicing breast self-awareness is  important. A change in your breasts can be a sign of a serious medical problem. Being familiar with how your breasts look and feel allows you to find any problems early, when treatment is more likely to be successful. All women should practice breast self-awareness, including women who have had breast implants. How to do a breast self-exam One way to learn what is normal for your breasts and whether your breasts are changing is to do a breast self-exam. To do a breast self-exam: Look for Changes  Remove all the clothing above your waist. Stand in front of a mirror in a room with good lighting. Put your hands on your hips. Push your hands firmly downward. Compare your breasts in the mirror. Look for differences between them (asymmetry), such as: Differences in shape. Differences in size. Puckers, dips, and bumps in one breast and not the other. Look at each breast for changes in your skin, such as: Redness. Scaly areas. Look for changes in your nipples, such as: Discharge. Bleeding. Dimpling. Redness. A change in position. Feel for Changes Carefully feel your breasts for lumps and changes. It is best to do this while lying on your back on the floor and again while sitting or standing in the shower or tub with soapy water on your skin. Feel each breast in the following way: Place the arm on the side of the breast you are examining above your head. Feel your breast with the other hand. Start in the nipple area and make  inch (2 cm) overlapping circles to feel your breast. Use the pads of your three middle fingers to do this. Apply light pressure, then medium pressure, then firm pressure. The light pressure will allow you to feel the tissue closest to the skin. The medium pressure will allow you to feel the tissue that is a little deeper. The firm pressure will allow you to feel the tissue close to the ribs. Continue the overlapping circles, moving downward over the breast until you feel your  ribs below your breast. Move one finger-width toward the center of the body. Continue to use the  inch (2 cm) overlapping circles to feel your breast as you move slowly up toward your collarbone. Continue the up and down exam using all three pressures until you reach your armpit.  Write Down What You Find  Write down what is normal for each breast and any changes that you find. Keep a written record with breast changes or normal findings for each breast. By writing this information down, you do not need to depend only on memory for size, tenderness, or location. Write down where you are in your menstrual cycle, if you are still menstruating. If you are having trouble noticing differences in your breasts, do not get discouraged. With time you will become more familiar with the variations in your breasts and more comfortable with the exam. How often should I examine my breasts? Examine your breasts every month. If you are breastfeeding, the best time to examine your breasts is after a feeding or after using a breast pump. If you menstruate, the best time to examine your breasts is 5-7 days after your period is over. During your period, your breasts are lumpier, and it may be more   difficult to notice changes. When should I see my health care provider? See your health care provider if you notice: A change in shape or size of your breasts or nipples. A change in the skin of your breast or nipples, such as a reddened or scaly area. Unusual discharge from your nipples. A lump or thick area that was not there before. Pain in your breasts. Anything that concerns you. Kegel Exercises Kegel exercises can help strengthen your pelvic floor muscles. The pelvic floor is a group of muscles that support your rectum, small intestine, and bladder. In females, pelvic floor muscles also help support the uterus. These muscles help you control the flow of urine and stool (feces). Kegel exercises are painless and  simple. They do not require any equipment. Your provider may suggest Kegel exercises to: Improve bladder and bowel control. Improve sexual response. Improve weak pelvic floor muscles after surgery to remove the uterus (hysterectomy) or after pregnancy, in females. Improve weak pelvic floor muscles after prostate gland removal or surgery, in males. Kegel exercises involve squeezing your pelvic floor muscles. These are the same muscles you squeeze when you try to stop the flow of urine or keep from passing gas. The exercises can be done while sitting, standing, or lying down, but it is best to vary your position. Ask your health care provider which exercises are safe for you. Do exercises exactly as told by your health care provider and adjust them as directed. Do not begin these exercises until told by your health care provider. Exercises How to do Kegel exercises: Squeeze your pelvic floor muscles tight. You should feel a tight lift in your rectal area. If you are a female, you should also feel a tightness in your vaginal area. Keep your stomach, buttocks, and legs relaxed. Hold the muscles tight for up to 10 seconds. Breathe normally. Relax your muscles for up to 10 seconds. Repeat as told by your health care provider. Repeat this exercise daily as told by your health care provider. Continue to do this exercise for at least 4-6 weeks, or for as Moroz as told by your health care provider. You may be referred to a physical therapist who can help you learn more about how to do Kegel exercises. Depending on your condition, your health care provider may recommend: Varying how [place] you squeeze your muscles. Doing several sets of exercises every day. Doing exercises for several weeks. Making Kegel exercises a part of your regular exercise routine. This information is not intended to replace advice given to you by your health care provider. Make sure you discuss any questions you have with your health  care provider. Document Revised: 05/24/2020 Document Reviewed: 05/24/2020 Elsevier Patient Education  2022 Elsevier Inc.  

## 2021-02-16 LAB — URINALYSIS, COMPLETE W/RFL CULTURE
Bacteria, UA: NONE SEEN /HPF
Bilirubin Urine: NEGATIVE
Glucose, UA: NEGATIVE
Hgb urine dipstick: NEGATIVE
Hyaline Cast: NONE SEEN /LPF
Leukocyte Esterase: NEGATIVE
Nitrites, Initial: NEGATIVE
Protein, ur: NEGATIVE
Specific Gravity, Urine: 1.021 (ref 1.001–1.035)
Squamous Epithelial / HPF: NONE SEEN /HPF (ref <=5)
WBC, UA: NONE SEEN /HPF (ref 0–5)
pH: 5.5 (ref 5.0–8.0)

## 2021-02-16 LAB — CULTURE INDICATED

## 2021-02-16 LAB — URINE CULTURE
MICRO NUMBER:: 12896525
Result:: NO GROWTH
SPECIMEN QUALITY:: ADEQUATE

## 2021-03-01 DIAGNOSIS — R059 Cough, unspecified: Secondary | ICD-10-CM | POA: Diagnosis not present

## 2021-03-01 DIAGNOSIS — Z20822 Contact with and (suspected) exposure to covid-19: Secondary | ICD-10-CM | POA: Diagnosis not present

## 2021-03-01 DIAGNOSIS — J101 Influenza due to other identified influenza virus with other respiratory manifestations: Secondary | ICD-10-CM | POA: Diagnosis not present

## 2021-03-05 DIAGNOSIS — E785 Hyperlipidemia, unspecified: Secondary | ICD-10-CM | POA: Diagnosis not present

## 2021-03-05 DIAGNOSIS — E559 Vitamin D deficiency, unspecified: Secondary | ICD-10-CM | POA: Diagnosis not present

## 2021-03-05 DIAGNOSIS — E039 Hypothyroidism, unspecified: Secondary | ICD-10-CM | POA: Diagnosis not present

## 2021-03-05 DIAGNOSIS — R7301 Impaired fasting glucose: Secondary | ICD-10-CM | POA: Diagnosis not present

## 2021-03-14 DIAGNOSIS — R7301 Impaired fasting glucose: Secondary | ICD-10-CM | POA: Diagnosis not present

## 2021-03-14 DIAGNOSIS — R82998 Other abnormal findings in urine: Secondary | ICD-10-CM | POA: Diagnosis not present

## 2021-03-14 DIAGNOSIS — Z1212 Encounter for screening for malignant neoplasm of rectum: Secondary | ICD-10-CM | POA: Diagnosis not present

## 2021-03-14 DIAGNOSIS — Z1339 Encounter for screening examination for other mental health and behavioral disorders: Secondary | ICD-10-CM | POA: Diagnosis not present

## 2021-03-14 DIAGNOSIS — Z0000 Encounter for general adult medical examination without abnormal findings: Secondary | ICD-10-CM | POA: Diagnosis not present

## 2021-03-14 DIAGNOSIS — Z1331 Encounter for screening for depression: Secondary | ICD-10-CM | POA: Diagnosis not present

## 2021-07-31 DIAGNOSIS — Z1231 Encounter for screening mammogram for malignant neoplasm of breast: Secondary | ICD-10-CM | POA: Diagnosis not present

## 2021-08-06 DIAGNOSIS — R922 Inconclusive mammogram: Secondary | ICD-10-CM | POA: Diagnosis not present

## 2021-08-06 DIAGNOSIS — N6323 Unspecified lump in the left breast, lower outer quadrant: Secondary | ICD-10-CM | POA: Diagnosis not present

## 2021-08-09 ENCOUNTER — Other Ambulatory Visit: Payer: Self-pay | Admitting: Radiology

## 2021-08-09 DIAGNOSIS — N6012 Diffuse cystic mastopathy of left breast: Secondary | ICD-10-CM | POA: Diagnosis not present

## 2021-08-09 DIAGNOSIS — N6323 Unspecified lump in the left breast, lower outer quadrant: Secondary | ICD-10-CM | POA: Diagnosis not present

## 2021-08-21 ENCOUNTER — Encounter: Payer: Self-pay | Admitting: Obstetrics and Gynecology

## 2021-08-26 ENCOUNTER — Encounter: Payer: Self-pay | Admitting: Obstetrics and Gynecology

## 2022-02-13 NOTE — Progress Notes (Unsigned)
67 y.o. G65P0040 Married White or Caucasian Not Hispanic or Latino female here for annual exam.  No vaginal bleeding. Rarely sexually active, uncomfortable.    TC risk is 31.3% for breast cancer. She saw genetics, didn't do testing secondary to expense. Has been offered breast MRI's   H/O HSV, on suppression for h/o frequent outbreaks.    Mild GSI, tolerable.   BM every 2-3 days, no change.   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   Patient's last menstrual period was 01/28/2011 (approximate).          Sexually active: No.  The current method of family planning is post menopausal status.    Exercising: Yes.    Gym/ health club routine includes cardio, light weights, and low impact aerobics. Smoker:  no  Health Maintenance: Pap:   02/07/19 Neg             01/06/17 Neg:Neg HR HPV History of abnormal Pap:  no MMG:  see epic had bx in July, 2023 BMD:   none  Colonoscopy: July 2021 normal f/u 5 years TDaP:  02/07/19 Gardasil: n/a   reports that she has quit smoking. Her smoking use included cigarettes. She has a 1.25 pack-year smoking history. She has never used smokeless tobacco. She reports current alcohol use of about 3.0 standard drinks of alcohol per week. She reports that she does not use drugs. 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 Mostly works from home. Plans to retire at the end of the year. Husband is a Banker.   Past Medical History:  Diagnosis Date   Bulging disc    lower back    Depression    situational   Family history of breast cancer    Family history of melanoma    Graves disease 9/96   hx of RAI   HSV infection    Hypothyroidism    Thyroid disease    Vertigo     Past Surgical History:  Procedure Laterality Date   CATARACT EXTRACTION Bilateral    CATARACT EXTRACTION W/ INTRAOCULAR LENS IMPLANT Bilateral    COLONOSCOPY W/ BIOPSIES  02/22/2009   tubular adenoma   KIDNEY DONATION  10/28/2019   LAPAROSCOPIC  APPENDECTOMY  10/03/2011   Procedure: APPENDECTOMY LAPAROSCOPIC;  Surgeon: Adin Hector, MD;  Location: [facility];  Service: General;  Laterality: N/A;    Current Outpatient Medications  Medication Sig Dispense Refill   acetaminophen (TYLENOL) 500 MG tablet Take 2 tablets by mouth three times a day for 5 days, then take 1-2 tablets three times a day as needed for pain     DULoxetine (CYMBALTA) 30 MG capsule Take 2 capsules by mouth daily.     Magnesium Gluconate 550 MG TABS Take by mouth.     rosuvastatin (CRESTOR) 10 MG tablet Take 10 mg by mouth. 3 times a week     SYNTHROID 112 MCG tablet Take 1 tablet by mouth daily.     valACYclovir (VALTREX) 500 MG tablet TAKE 1 TABLET DAILY, INCREASE TO ONE TABLET TWICE A DAY FOR THREE DAYS PRN OUTBREAK 90 tablet 3   Vitamin D, Ergocalciferol, (DRISDOL) 50000 units CAPS capsule Take 1 capsule by mouth once a week.     No current facility-administered medications for this visit.    Family History  Problem Relation Age of Onset   Breast cancer Mother 20       breast - died age 36   Hyperlipidemia Father  Hyperlipidemia Sister    Diabetes Sister    Breast cancer Sister 79       Lumpectomy and Radiation   Diabetes Brother    Diabetes Sister    Diabetes Sister    Kidney failure Sister    Muscular dystrophy Sister    Breast cancer Other        dx 35s    Review of Systems  Exam:   BP 110/72   Pulse 82   Ht 4' 11.84" (1.52 m)   Wt 160 lb (72.6 kg)   LMP 01/28/2011 (Approximate) Comment: postmenopausal  SpO2 100%   BMI 31.41 kg/m   Weight change: @WEIGHTCHANGE @ Height:   Height: 4' 11.84" (152 cm)  Ht Readings from Last 3 Encounters:  02/20/22 4' 11.84" (1.52 m)  02/14/21 5' (1.524 m)  02/09/20 5' (1.524 m)    General appearance: alert, cooperative and appears stated age Head: Normocephalic, without obvious abnormality, atraumatic Neck: no adenopathy, supple, symmetrical, trachea midline and thyroid normal to inspection and  palpation Lungs: clear to auscultation bilaterally Cardiovascular: regular rate and rhythm Breasts: normal appearance, no masses or tenderness Abdomen: soft, non-tender; non distended,  no masses,  no organomegaly Extremities: extremities normal, atraumatic, no cyanosis or edema Skin: Skin color, texture, turgor normal. No rashes or lesions Lymph nodes: Cervical, supraclavicular, and axillary nodes normal. No abnormal inguinal nodes palpated Neurologic: Grossly normal   Pelvic: External genitalia:  no lesions              Urethra:  normal appearing urethra with no masses, tenderness or lesions              Bartholins and Skenes: normal                 Vagina: normal appearing vagina with normal color and discharge, no lesions              Cervix: no lesions and stenotis               Bimanual Exam:  Uterus:  normal size, contour, position, consistency, mobility, non-tender and anteverted              Adnexa: no mass, fullness, tenderness               Rectovaginal: Confirms               Anus:  normal sphincter tone, no lesions  Gae Dry, CMA chaperoned for the exam.  1. Well woman exam Discussed breast self exam Discussed calcium and vit D intake Colonoscopy is UTD Labs with primary  2. History of herpes genitalis Doing well on suppression - valACYclovir (VALTREX) 500 MG tablet; TAKE 1 TABLET DAILY, INCREASE TO ONE TABLET TWICE A DAY FOR THREE DAYS PRN OUTBREAK  Dispense: 90 tablet; Refill: 4  3. At high risk for breast cancer Mammogram in 7/24 Discussed MRI, she declines for now  4. Screening for cervical cancer - Cytology - PAP

## 2022-02-20 ENCOUNTER — Other Ambulatory Visit (HOSPITAL_COMMUNITY)
Admission: RE | Admit: 2022-02-20 | Discharge: 2022-02-20 | Disposition: A | Discharge status: Home / Self Care | Payer: BC Managed Care – PPO | Source: Ambulatory Visit | Attending: Obstetrics and Gynecology | Admitting: Obstetrics and Gynecology

## 2022-02-20 ENCOUNTER — Ambulatory Visit (INDEPENDENT_AMBULATORY_CARE_PROVIDER_SITE_OTHER): Payer: BC Managed Care – PPO | Admitting: Obstetrics and Gynecology

## 2022-02-20 ENCOUNTER — Encounter: Payer: Self-pay | Admitting: Obstetrics and Gynecology

## 2022-02-20 VITALS — BP 110/72 | HR 82 | Ht 59.84 in | Wt 160.0 lb

## 2022-02-20 DIAGNOSIS — Z124 Encounter for screening for malignant neoplasm of cervix: Secondary | ICD-10-CM | POA: Insufficient documentation

## 2022-02-20 DIAGNOSIS — Z01419 Encounter for gynecological examination (general) (routine) without abnormal findings: Secondary | ICD-10-CM

## 2022-02-20 DIAGNOSIS — Z9189 Other specified personal risk factors, not elsewhere classified: Secondary | ICD-10-CM

## 2022-02-20 DIAGNOSIS — Z8619 Personal history of other infectious and parasitic diseases: Secondary | ICD-10-CM

## 2022-02-20 MED ORDER — VALACYCLOVIR HCL 500 MG PO TABS: ORAL_TABLET | ORAL | 4 refills | Status: DC

## 2022-02-20 NOTE — Patient Instructions (Signed)

## 2022-02-27 LAB — CYTOLOGY - PAP
Comment: NEGATIVE
Diagnosis: NEGATIVE
High risk HPV: NEGATIVE

## 2022-03-19 DIAGNOSIS — E785 Hyperlipidemia, unspecified: Secondary | ICD-10-CM | POA: Diagnosis not present

## 2022-03-19 DIAGNOSIS — E559 Vitamin D deficiency, unspecified: Secondary | ICD-10-CM | POA: Diagnosis not present

## 2022-03-19 DIAGNOSIS — E039 Hypothyroidism, unspecified: Secondary | ICD-10-CM | POA: Diagnosis not present

## 2022-03-19 DIAGNOSIS — R7301 Impaired fasting glucose: Secondary | ICD-10-CM | POA: Diagnosis not present

## 2022-03-20 ENCOUNTER — Other Ambulatory Visit: Payer: Self-pay | Admitting: Obstetrics and Gynecology

## 2022-03-20 DIAGNOSIS — Z8619 Personal history of other infectious and parasitic diseases: Secondary | ICD-10-CM

## 2022-03-20 NOTE — Telephone Encounter (Signed)
I sent a 90 day supply with refills. [facility]. They should have it.

## 2022-03-20 NOTE — Telephone Encounter (Signed)
AEX 02/20/2022  This Rx was sent at visit.  [facility] mail order fill [facility] term Rx for patients and want 90 days supply sent. (She probably just needs to get this at her local pharmacy.)  Pls advise

## 2022-03-30 DIAGNOSIS — Z1212 Encounter for screening for malignant neoplasm of rectum: Secondary | ICD-10-CM | POA: Diagnosis not present

## 2022-04-01 DIAGNOSIS — Z23 Encounter for immunization: Secondary | ICD-10-CM | POA: Diagnosis not present

## 2022-04-01 DIAGNOSIS — Z1331 Encounter for screening for depression: Secondary | ICD-10-CM | POA: Diagnosis not present

## 2022-04-01 DIAGNOSIS — E669 Obesity, unspecified: Secondary | ICD-10-CM | POA: Diagnosis not present

## 2022-04-01 DIAGNOSIS — R82998 Other abnormal findings in urine: Secondary | ICD-10-CM | POA: Diagnosis not present

## 2022-04-01 DIAGNOSIS — Z0000 Encounter for general adult medical examination without abnormal findings: Secondary | ICD-10-CM | POA: Diagnosis not present

## 2022-04-01 DIAGNOSIS — E039 Hypothyroidism, unspecified: Secondary | ICD-10-CM | POA: Diagnosis not present

## 2022-04-01 DIAGNOSIS — Z1339 Encounter for screening examination for other mental health and behavioral disorders: Secondary | ICD-10-CM | POA: Diagnosis not present

## 2022-04-01 DIAGNOSIS — E559 Vitamin D deficiency, unspecified: Secondary | ICD-10-CM | POA: Diagnosis not present

## 2022-04-01 DIAGNOSIS — R7301 Impaired fasting glucose: Secondary | ICD-10-CM | POA: Diagnosis not present

## 2022-04-02 DIAGNOSIS — E785 Hyperlipidemia, unspecified: Secondary | ICD-10-CM | POA: Diagnosis not present

## 2022-04-02 DIAGNOSIS — E669 Obesity, unspecified: Secondary | ICD-10-CM | POA: Diagnosis not present

## 2022-04-02 DIAGNOSIS — R7301 Impaired fasting glucose: Secondary | ICD-10-CM | POA: Diagnosis not present

## 2022-08-04 DIAGNOSIS — Z1231 Encounter for screening mammogram for malignant neoplasm of breast: Secondary | ICD-10-CM | POA: Diagnosis not present

## 2022-08-07 ENCOUNTER — Encounter: Payer: Self-pay | Admitting: Obstetrics and Gynecology

## 2023-05-25 NOTE — Progress Notes (Unsigned)
 GYNECOLOGY: ANNUAL EXAM   Subjective:    PCP: Rosslyn Coons, MD Kimberly Dorsey is a 68 y.o. female [phone] who presents for annual wellness visit.  Concerns:  Would like a refill on her Valtrex ; hx of HSV-2 and takes as suppression. No active outbreaks in a "[place]yers time."   Well Woman Visit:  GYN HISTORY:  Patient's last menstrual period was 01/28/2011 (approximate).     Menstrual History: OB History     Gravida  4   Para  0   Term  0   Preterm  0   AB  4   Living  0      SAB  0   IAB  4   Ectopic  0   Multiple  0   Live Births  0           Menarche age: 46 Patient's last menstrual period was 01/28/2011 (approximate).   Postmenopausal bleeding, spotting, or discharge? no Urinary incontinence? sometimes  Sexually active: no Number of sexual partners: 0 Gender of sexual Partners: not active Social History   Substance and Sexual Activity  Sexual Activity Not Currently   Partners: Male   Birth control/protection: Post-menopausal   Contraceptive methods: no method Dyspareunia? no STI history: hsv STI/HIV testing or immunizations needed? No.   Health Maintenance: -Last pap: approximate date 02/20/22 and was normal NILM and HPV neg --> Any abnormals: no -Last mammogram: 08/04/22 --> Any abnormals? Yes has a marker came back fine -Last colon cancer screen: utd 07/2019 / Type: colonoscopy -Last DEXA scan: ordered today  -[facility] of Breast / Colon / Cervical cancer: yes -Vaccines:  Immunization History  Administered Date(s) Administered   Influenza,inj,quad, With Preservative 11/01/2019   Influenza-Unspecified 11/07/2014, 10/28/2018   PFIZER(Purple Top)SARS-COV-2 Vaccination 04/07/2019, 05/05/2019   Tdap 10/29/2009, 02/07/2019   Last Tdap: utd / Flu: utd / COVID: utd / Gardasil: age out / Shingles (50+): utd / PCV20: no -Hep C screen: utd -Last lipid / glucose screening: pcp  > Exercise: , moderately active > Dietary Supplements: Folate: No;   Calcium: No}; Vitamin D: Yes > Body mass index is 30.27 kg/m.  > Recent dental visit Yes.   > Seat Belt Use: Yes.   > Texting and driving? No. > Guns in the house No. > Recreational or other drug use: denied.   Social History   Tobacco Use   Smoking status: Former    Current packs/day: 0.25    Average packs/day: 0.3 packs/day for 5.0 years (1.3 ttl pk-yrs)    Types: Cigarettes   Smokeless tobacco: Never  Substance Use Topics   Alcohol  use: Yes    Alcohol /week: 3.0 standard drinks of alcohol     Types: 3 Standard drinks or equivalent per week   Occupation: retired    Lives with: Husband     PHQ-2 Score: In last two weeks, how often have you felt: Little interest or pleasure in doing things: Not at all (0) Feeling down, depressed or hopeless: Several days (+1) Score:1   GAD-2 Over the last 2 weeks, how often have you been bothered by the following problems? Feeling nervous, anxious or on edge: Not at all (0) Not being able to stop or control worrying: Not at all (0)} Score:0 _________________________________________________________  Current Outpatient Medications  Medication Sig Dispense Refill   acetaminophen  (TYLENOL ) 500 MG tablet Take 2 tablets by mouth three times a day for 5 days, then take 1-2 tablets three times a day as needed for pain  DULoxetine (CYMBALTA) 30 MG capsule Take 2 capsules by mouth daily.     ezetimibe (ZETIA) 10 MG tablet Take 10 mg by mouth daily.     rosuvastatin (CRESTOR) 10 MG tablet Take 10 mg by mouth. 3 times a week     SYNTHROID 112 MCG tablet Take 1 tablet by mouth daily.     valACYclovir  (VALTREX ) 500 MG tablet TAKE 1 TABLET DAILY, INCREASE TO ONE TABLET TWICE A DAY FOR THREE DAYS PRN OUTBREAK 90 tablet 4   Vitamin D, Ergocalciferol, (DRISDOL) 50000 units CAPS capsule Take 1 capsule by mouth once a week.     Magnesium Gluconate 550 MG TABS Take by mouth. (Patient not taking: Reported on 05/26/2023)     No current facility-administered  medications for this visit.   No Known Allergies  Past Medical History:  Diagnosis Date   Bulging disc    lower back    Depression    situational   Family history of breast cancer    Family history of melanoma    Graves disease 09/1994   hx of RAI   HSV infection    Hypothyroidism    Increased risk of breast cancer    Thyroid  disease    Vertigo    Past Surgical History:  Procedure Laterality Date   CATARACT EXTRACTION Bilateral    CATARACT EXTRACTION W/ INTRAOCULAR LENS IMPLANT Bilateral    COLONOSCOPY W/ BIOPSIES  02/22/2009   tubular adenoma   KIDNEY DONATION  10/28/2019   LAPAROSCOPIC APPENDECTOMY  10/03/2011   Procedure: APPENDECTOMY LAPAROSCOPIC;  Surgeon: Levert Ready, MD;  Location: [facility];  Service: General;  Laterality: N/A;    Review Of Systems  Constitutional: Denied constitutional symptoms, night sweats, recent illness, fatigue, fever, insomnia and weight loss.  Eyes: Denied eye symptoms, eye pain, photophobia, vision change and visual disturbance.  Ears/Nose/Throat/Neck: Denied ear, nose, throat or neck symptoms, hearing loss, nasal discharge, sinus congestion and sore throat.  Cardiovascular: Denied cardiovascular symptoms, arrhythmia, chest pain/pressure, edema, exercise intolerance, orthopnea and palpitations.  Respiratory: Denied pulmonary symptoms, asthma, pleuritic pain, productive sputum, cough, dyspnea and wheezing.  Gastrointestinal: Denied, gastro-esophageal reflux, melena, nausea and vomiting.  Genitourinary: Denied genitourinary symptoms including symptomatic vaginal discharge, pelvic relaxation issues, and urinary complaints.  Musculoskeletal: Denied musculoskeletal symptoms, stiffness, swelling, muscle weakness and myalgia.  Dermatologic: Denied dermatology symptoms, rash and scar.  Neurologic: Denied neurology symptoms, dizziness, headache, neck pain and syncope.  Psychiatric: Denied psychiatric symptoms, anxiety and depression.  Endocrine:  Denied endocrine symptoms including hot flashes and night sweats.      Objective:    BP 125/79   Pulse 76   Ht 5' (1.524 m)   Wt 155 lb (70.3 kg)   LMP 01/28/2011 (Approximate) Comment: postmenopausal  BMI 30.27 kg/m   Constitutional: Well-developed, well-nourished female in no acute distress Neurological: Alert and oriented to person, place, and time Psychiatric: Mood and affect appropriate Skin: No rashes or lesions Neck: Supple without masses. Trachea is midline.Thyroid  is normal size without masses Lymphatics: No cervical, axillary, supraclavicular, or inguinal adenopathy noted Respiratory: Clear to auscultation bilaterally. Good air movement with normal work of breathing. Cardiovascular: Regular rate and rhythm. Extremities grossly normal, nontender with no edema; pulses regular Gastrointestinal: Soft, nontender, nondistended. No masses or hernias appreciated. No hepatosplenomegaly. No fluid wave. No rebound or guarding. Breast Exam: normal appearance, no masses or tenderness, Inspection negative, No nipple retraction or dimpling, No nipple discharge or bleeding, No axillary or supraclavicular adenopathy, Normal to palpation without  dominant masses Genitourinary:         External Genitalia: Normal female genitalia    Vagina: Normal mucosa, no lesions.    Cervix: No lesions, normal size and consistency; no cervical motion tenderness; non-friable; Pap not obtained.    Uterus: Normal size and contour; smooth, mobile, NT. Adnexae: Non-palpable and non-tender Perineum/Anus: No lesions Rectal: deferred    Assessment/Plan:    Kimberly Dorsey is a 68 y.o. female G74P0040 with normal well-woman gynecologic exam.  -Screenings:  Pap: recommendation to age out discussed, pt desires to stop paps Mammogram: ordered Dexa: ordered Colon: PCP Labs: PCP GAD/PHQ-2 = 1 -Vaccines: Recommend PCV20 -Healthy lifestyle modifications discussed: multivitamin, diet, exercise, sunscreen, tobacco and  alcohol  use. Emphasized importance of regular physical activity.  -Calcium and Vit D recommendation reviewed.  -All questions answered to patient's satisfaction.   HSV-2: -Stable without recent outbreaks -Refilled Valtrex , episodic dosing for the next year   Return in about 1 year (around 05/25/2024) for annual. Sooner prn.    Sofia Dunn, DO  OB/GYN at [facility]

## 2023-05-26 ENCOUNTER — Encounter: Payer: Self-pay | Admitting: Obstetrics

## 2023-05-26 ENCOUNTER — Ambulatory Visit (INDEPENDENT_AMBULATORY_CARE_PROVIDER_SITE_OTHER): Payer: Self-pay | Admitting: Obstetrics

## 2023-05-26 VITALS — BP 125/79 | HR 76 | Ht 60.0 in | Wt 155.0 lb

## 2023-05-26 DIAGNOSIS — Z1231 Encounter for screening mammogram for malignant neoplasm of breast: Secondary | ICD-10-CM

## 2023-05-26 DIAGNOSIS — Z01419 Encounter for gynecological examination (general) (routine) without abnormal findings: Secondary | ICD-10-CM

## 2023-05-26 DIAGNOSIS — A6000 Herpesviral infection of urogenital system, unspecified: Secondary | ICD-10-CM

## 2023-05-26 DIAGNOSIS — Z13820 Encounter for screening for osteoporosis: Secondary | ICD-10-CM

## 2023-05-26 NOTE — Patient Instructions (Signed)
 Preventive Care 16-66 Years Old, Female  Preventive care refers to lifestyle choices and visits with your health care provider that can promote health and wellness. Preventive care visits are also called wellness exams.  What can I expect for my preventive care visit?  Counseling  Your health care provider may ask you questions about your:  Medical history, including:  Past medical problems.  Family medical history.  Pregnancy history.  Current health, including:  Menstrual cycle.  Method of birth control.  Emotional well-being.  Home life and relationship well-being.  Sexual activity and sexual health.  Lifestyle, including:  Alcohol, nicotine or tobacco, and drug use.  Access to firearms.  Diet, exercise, and sleep habits.  Work and work Astronomer.  Sunscreen use.  Safety issues such as seatbelt and bike helmet use.  Physical exam  Your health care provider will check your:  Height and weight. These may be used to calculate your BMI (body mass index). BMI is a measurement that tells if you are at a healthy weight.  Waist circumference. This measures the distance around your waistline. This measurement also tells if you are at a healthy weight and may help predict your risk of certain diseases, such as type 2 diabetes and high blood pressure.  Heart rate and blood pressure.  Body temperature.  Skin for abnormal spots.  What immunizations do I need?    Vaccines are usually given at various ages, according to a schedule. Your health care provider will recommend vaccines for you based on your age, medical history, and lifestyle or other factors, such as travel or where you work.  What tests do I need?  Screening  Your health care provider may recommend screening tests for certain conditions. This may include:  Lipid and cholesterol levels.  Diabetes screening. This is done by checking your blood sugar (glucose) after you have not eaten for a while (fasting).  Pelvic exam and Pap test.  Hepatitis B test.  Hepatitis C  test.  HIV (human immunodeficiency virus) test.  STI (sexually transmitted infection) testing, if you are at risk.  Lung cancer screening.  Colorectal cancer screening.  Mammogram. Talk with your health care provider about when you should start having regular mammograms. This may depend on whether you have a family history of breast cancer.  BRCA-related cancer screening. This may be done if you have a family history of breast, ovarian, tubal, or peritoneal cancers.  Bone density scan. This is done to screen for osteoporosis.  Talk with your health care provider about your test results, treatment options, and if necessary, the need for more tests.  Follow these instructions at home:  Eating and drinking    Eat a diet that includes fresh fruits and vegetables, whole grains, lean protein, and low-fat dairy products.  Take vitamin and mineral supplements as recommended by your health care provider.  Do not drink alcohol if:  Your health care provider tells you not to drink.  You are pregnant, may be pregnant, or are planning to become pregnant.  If you drink alcohol:  Limit how much you have to 0-1 drink a day.  Know how much alcohol is in your drink. In the [place] one drink equals one 12 oz bottle of beer (355 mL), one 5 oz glass of wine (148 mL), or one 1 oz glass of hard liquor (44 mL).  Lifestyle  Brush your teeth every morning and night with fluoride toothpaste. Floss one time each day.  Exercise for at least  30 minutes 5 or more days each week.  Do not use any products that contain nicotine or tobacco. These products include cigarettes, chewing tobacco, and vaping devices, such as e-cigarettes. If you need help quitting, ask your health care provider.  Do not use drugs.  If you are sexually active, practice safe sex. Use a condom or other form of protection to prevent STIs.  If you do not wish to become pregnant, use a form of birth control. If you plan to become pregnant, see your health care provider for a  prepregnancy visit.  Take aspirin only as told by your health care provider. Make sure that you understand how much to take and what form to take. Work with your health care provider to find out whether it is safe and beneficial for you to take aspirin daily.  Find healthy ways to manage stress, such as:  Meditation, yoga, or listening to music.  Journaling.  Talking to a trusted person.  Spending time with friends and family.  Minimize exposure to UV radiation to reduce your risk of skin cancer.  Safety  Always wear your seat belt while driving or riding in a vehicle.  Do not drive:  If you have been drinking alcohol. Do not ride with someone who has been drinking.  When you are tired or distracted.  While texting.  If you have been using any mind-altering substances or drugs.  Wear a helmet and other protective equipment during sports activities.  If you have firearms in your house, make sure you follow all gun safety procedures.  Seek help if you have been physically or sexually abused.  What's next?  Visit your health care provider once a year for an annual wellness visit.  Ask your health care provider how often you should have your eyes and teeth checked.  Stay up to date on all vaccines.  This information is not intended to replace advice given to you by your health care provider. Make sure you discuss any questions you have with your health care provider.  Document Revised: 07/11/2020 Document Reviewed: 07/11/2020  Elsevier Patient Education  2024 [employer].

## 2023-05-27 MED ORDER — VALACYCLOVIR HCL 1 G PO TABS: 1000.0000 mg | ORAL_TABLET | Freq: Every day | ORAL | 3 refills | Status: AC

## 2023-05-27 MED ORDER — VALACYCLOVIR HCL 1 G PO TABS: 1000.0000 mg | ORAL_TABLET | Freq: Every day | ORAL | 3 refills | Status: DC

## 2023-05-27 NOTE — Addendum Note (Signed)
 Addended by: Venetta Gill on: 05/27/2023 03:41 PM   Modules accepted: Orders

## 2023-06-09 ENCOUNTER — Inpatient Hospital Stay
Admission: RE | Admit: 2023-06-09 | Discharge: 2023-06-09 | Disposition: A | Discharge status: Home / Self Care | Payer: Self-pay | Source: Ambulatory Visit | Attending: Endocrinology | Admitting: Endocrinology

## 2023-06-09 ENCOUNTER — Other Ambulatory Visit: Payer: Self-pay | Admitting: *Deleted

## 2023-06-09 DIAGNOSIS — Z1231 Encounter for screening mammogram for malignant neoplasm of breast: Secondary | ICD-10-CM

## 2023-08-10 ENCOUNTER — Ambulatory Visit
Admission: RE | Admit: 2023-08-10 | Discharge: 2023-08-10 | Disposition: A | Discharge status: Home / Self Care | Source: Ambulatory Visit | Attending: Obstetrics | Admitting: Obstetrics

## 2023-08-10 DIAGNOSIS — M85851 Other specified disorders of bone density and structure, right thigh: Secondary | ICD-10-CM | POA: Diagnosis not present

## 2023-08-10 DIAGNOSIS — Z13820 Encounter for screening for osteoporosis: Secondary | ICD-10-CM | POA: Diagnosis not present

## 2023-08-10 DIAGNOSIS — M8589 Other specified disorders of bone density and structure, multiple sites: Secondary | ICD-10-CM | POA: Diagnosis not present

## 2023-08-10 DIAGNOSIS — Z1231 Encounter for screening mammogram for malignant neoplasm of breast: Secondary | ICD-10-CM | POA: Insufficient documentation

## 2023-08-10 DIAGNOSIS — Z780 Asymptomatic menopausal state: Secondary | ICD-10-CM | POA: Diagnosis not present

## 2023-08-14 DIAGNOSIS — H5203 Hypermetropia, bilateral: Secondary | ICD-10-CM | POA: Diagnosis not present

## 2023-08-14 DIAGNOSIS — H52223 Regular astigmatism, bilateral: Secondary | ICD-10-CM | POA: Diagnosis not present

## 2023-08-17 ENCOUNTER — Ambulatory Visit: Payer: Self-pay | Admitting: Obstetrics

## 2023-08-27 ENCOUNTER — Ambulatory Visit: Admitting: Dermatology

## 2023-08-31 ENCOUNTER — Encounter: Payer: Self-pay | Admitting: Dermatology

## 2023-08-31 ENCOUNTER — Ambulatory Visit: Admitting: Dermatology

## 2023-08-31 DIAGNOSIS — L814 Other melanin hyperpigmentation: Secondary | ICD-10-CM

## 2023-08-31 DIAGNOSIS — D229 Melanocytic nevi, unspecified: Secondary | ICD-10-CM | POA: Diagnosis not present

## 2023-08-31 DIAGNOSIS — L578 Other skin changes due to chronic exposure to nonionizing radiation: Secondary | ICD-10-CM

## 2023-08-31 DIAGNOSIS — D1801 Hemangioma of skin and subcutaneous tissue: Secondary | ICD-10-CM

## 2023-08-31 DIAGNOSIS — L729 Follicular cyst of the skin and subcutaneous tissue, unspecified: Secondary | ICD-10-CM

## 2023-08-31 DIAGNOSIS — Z1283 Encounter for screening for malignant neoplasm of skin: Secondary | ICD-10-CM

## 2023-08-31 DIAGNOSIS — L821 Other seborrheic keratosis: Secondary | ICD-10-CM | POA: Diagnosis not present

## 2023-08-31 DIAGNOSIS — L813 Cafe au lait spots: Secondary | ICD-10-CM

## 2023-08-31 NOTE — Progress Notes (Signed)
   New Patient Visit   Subjective  Kimberly Dorsey is a 68 y.o. female who presents for the following: Skin Cancer Screening and Full Body Skin Exam.  Place above L ear present about a year, dry place. Patient reports cyst at scalp and some other small ones that patient would like removed. Patient reports she has had about five or six cut out by another dermatologist in [place].   The patient presents for Total-Body Skin Exam (TBSE) for skin cancer screening and mole check. The patient has spots, moles and lesions to be evaluated, some may be new or changing and the patient may have concern these could be cancer.   The following portions of the chart were reviewed this encounter and updated as appropriate: medications, allergies, medical history  Review of Systems:  No other skin or systemic complaints except as noted in HPI or Assessment and Plan.  Objective  Well appearing patient in no apparent distress; mood and affect are within normal limits.  A full examination was performed including scalp, head, eyes, ears, nose, lips, neck, chest, axillae, abdomen, back, buttocks, bilateral upper extremities, bilateral lower extremities, hands, feet, fingers, toes, fingernails, and toenails. All findings within normal limits unless otherwise noted below.  Exam of nails limited by presence of nail polish.    Relevant physical exam findings are noted in the Assessment and Plan.    Assessment & Plan   SKIN CANCER SCREENING PERFORMED TODAY.  ACTINIC DAMAGE - Chronic condition, secondary to cumulative UV/sun exposure - diffuse scaly erythematous macules with underlying dyspigmentation - Recommend daily broad spectrum sunscreen SPF 30+ to sun-exposed areas, reapply every 2 hours as needed.  - Staying in the shade or wearing Mallis sleeves, sun glasses (UVA+UVB protection) and wide brim hats (4-inch brim around the entire circumference of the hat) are also recommended for sun protection.  - Call  for new or changing lesions.  LENTIGINES, SEBORRHEIC KERATOSES, HEMANGIOMAS - Benign normal skin lesions - Benign-appearing - Call for any changes  MELANOCYTIC NEVI - Tan-brown and/or pink-flesh-colored symmetric macules and papules - Benign appearing on exam today - Observation - Call clinic for new or changing moles - Recommend daily use of broad spectrum spf 30+ sunscreen to sun-exposed areas.   SEBORRHEIC KERATOSIS - Stuck-on, waxy, tan-brown papules and/or plaques above L ear, L breast - Benign-appearing - Discussed benign etiology and prognosis. - Observe - Call for any changes  Pilar Cyst vs EIC Exam: Subcutaneous nodules x 3 at scalp, vertex central and right, occipital  Benign-appearing. 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 Discussed that a cyst is a benign growth that can grow over time and sometimes get irritated or inflamed. Recommend observation if it is not bothersome. Discussed option of surgical excision to remove it if it is growing, symptomatic, or other changes noted. Please call for new or changing lesions so they can be evaluated.  Patient will schedule excision with Dr. Claudene.   Cafe au Lait  - Tan patch at R upper arm  - Genetic - Benign, observe - Call for any changes   Return for cyst surgery, 2 years TBSE, w/ Dr. Claudene.  I, Jacquelynn V. Wilfred, CMA, am acting as scribe for Boneta Claudene, MD .   Documentation: I have reviewed the above documentation for accuracy and completeness, and I agree with the above.  Boneta Claudene, MD

## 2023-08-31 NOTE — Patient Instructions (Addendum)
 Pre-Operative Instructions You are scheduled for a surgical procedure at [facility]. We recommend you read the following instructions. If you have any questions or concerns, please call the office at [phone].  Shower and wash the entire body with soap and water the day of your surgery paying special attention to cleansing at and around the planned surgery site.  Please continue to take your anticoagulants (blood thinners) as you normally     would before and after surgery if they were prescribed by a medical provider. Stopping them could be harmful to you. We have multiple tools in dermatology to stop the bleeding even if you take an anticoagulant. If you take over the counter blood thinner such as aspirin, Ibuprofen (Motrin, Advil and Nuprin), Naprosyn, Voltaren, Relafen, etc. that was not prescribed or recommended by a medical provider, we recommend that you stop taking it for a week before your surgery and wait to restart until 2 days after your surgery.  Please inform us  of all medications you are currently taking. All medications that are taken regularly should be taken the day of surgery as you always do. Nevertheless, we need to be informed of what medications you are taking prior to surgery to know whether they will affect the procedure or cause any complications.   Please inform us  of any medication allergies. Also inform us  of whether you have allergies to Latex or rubber products or whether you have had any adverse reaction to Lidocaine  or Epinephrine.  Please inform us  of any prosthetic or artificial body parts such as artificial heart valve, joint replacements, etc., or similar condition that might require preoperative antibiotics.   We recommend avoidance of alcohol  at least two weeks prior to surgery and continued avoidance for at least two weeks after surgery.   We recommend discontinuation of tobacco smoking at least two weeks prior to surgery and continued  abstinence for at least two weeks after surgery.  Do not plan strenuous exercise, strenuous work or strenuous lifting for approximately four weeks after your surgery.   We request if you are unable to make your scheduled surgical appointment, please call us  at least a week in advance or as soon as you are aware of a problem so that we can cancel or reschedule the appointment.   You MAY TAKE TYLENOL  (acetaminophen ) for pain as it is not a blood thinner.   PLEASE PLAN TO BE IN TOWN FOR TWO WEEKS FOLLOWING SURGERY, THIS IS IMPORTANT SO YOU CAN BE CHECKED FOR DRESSING CHANGES, FUTURE REMOVAL AND TO MONITOR FOR POSSIBLE COMPLICATIONS.     Recommend daily broad spectrum sunscreen SPF 30+ to sun-exposed areas, reapply every 2 hours as needed. Call for new or changing lesions.  Staying in the shade or wearing Mellen sleeves, sun glasses (UVA+UVB protection) and wide brim hats (4-inch brim around the entire circumference of the hat) are also recommended for sun protection.    Melanoma ABCDEs  Melanoma is the most dangerous type of skin cancer, and is the leading cause of death from skin disease.  You are more likely to develop melanoma if you: Have light-colored skin, light-colored eyes, or red or blond hair Spend a lot of time in the sun Tan regularly, either outdoors or in a tanning bed Have had blistering sunburns, especially during childhood Have a close family member who has had a melanoma Have atypical moles or large birthmarks  Early detection of melanoma is key since treatment is typically straightforward and cure rates are  extremely high if we catch it early.   The first sign of melanoma is often a change in a mole or a new dark spot.  The ABCDE system is a way of remembering the signs of melanoma.  A for asymmetry:  The two halves do not match. B for border:  The edges of the growth are irregular. C for color:  A mixture of colors are present instead of an even brown color. D for  diameter:  Melanomas are usually (but not always) greater than 6mm - the size of a pencil eraser. E for evolution:  The spot keeps changing in size, shape, and color.  Please check your skin once per month between visits. You can use a small mirror in front and a large mirror behind you to keep an eye on the back side or your body.   If you see any new or changing lesions before your next follow-up, please call to schedule a visit.  Please continue daily skin protection including broad spectrum sunscreen SPF 30+ to sun-exposed areas, reapplying every 2 hours as needed when you're outdoors.   Staying in the shade or wearing Soy sleeves, sun glasses (UVA+UVB protection) and wide brim hats (4-inch brim around the entire circumference of the hat) are also recommended for sun protection.       Due to recent changes in healthcare laws, you may see results of your pathology and/or laboratory studies on MyChart before the doctors have had a chance to review them. We understand that in some cases there may be results that are confusing or concerning to you. Please understand that not all results are received at the same time and often the doctors may need to interpret multiple results in order to provide you with the best plan of care or course of treatment. Therefore, we ask that you please give us  2 business days to thoroughly review all your results before contacting the office for clarification. Should we see a critical lab result, you will be contacted sooner.   If You Need Anything After Your Visit  If you have any questions or concerns for your doctor, please call our main line at [phone] and press option 4 to reach your doctor's medical assistant. If no one answers, please leave a voicemail as directed and we will return your call as soon as possible. Messages left after 4 pm will be answered the following business day.   You may also send us  a message via MyChart. We typically respond to  MyChart messages within 1-2 business days.  For prescription refills, please ask your pharmacy to contact our office. Our fax number is [phone].  If you have an urgent issue when the clinic is closed that cannot wait until the next business day, you can page your doctor at the number below.    Please note that while we do our best to be available for urgent issues outside of office hours, we are not available 24/7.   If you have an urgent issue and are unable to reach us , you may choose to seek medical care at your doctor's office, retail clinic, urgent care center, or emergency room.  If you have a medical emergency, please immediately call 911 or go to the emergency department.  Pager Numbers  - Dr. Hester: [phone]  - Dr. Jackquline: [phone]  - Dr. Claudene: [phone]   In the event of inclement weather, please call our main line at [phone] for an update on the status of  any delays or closures.  Dermatology Medication Tips: Please keep the boxes that topical medications come in in order to help keep track of the instructions about where and how to use these. Pharmacies typically print the medication instructions only on the boxes and not directly on the medication tubes.   If your medication is too expensive, please contact our office at [phone] option 4 or send us  a message through MyChart.   We are unable to tell what your co-pay for medications will be in advance as this is different depending on your insurance coverage. However, we may be able to find a substitute medication at lower cost or fill out paperwork to get insurance to cover a needed medication.   If a prior authorization is required to get your medication covered by your insurance company, please allow us  1-2 business days to complete this process.  Drug prices often vary depending on where the prescription is filled and some pharmacies may offer cheaper prices.  The website www.goodrx.com  contains coupons for medications through different pharmacies. The prices here do not account for what the cost may be with help from insurance (it may be cheaper with your insurance), but the website can give you the price if you did not use any insurance.  - You can print the associated coupon and take it with your prescription to the pharmacy.  - You may also stop by our office during regular business hours and pick up a GoodRx coupon card.  - If you need your prescription sent electronically to a different pharmacy, notify our office through [facility] or by phone at [phone] option 4.     Si Usted Necesita Algo Despus de Su Visita  Tambin puede enviarnos un mensaje a travs de Clinical cytogeneticist. Por lo general respondemos a los mensajes de MyChart en el transcurso de 1 a 2 das hbiles.  Para renovar recetas, por favor pida a su farmacia que se ponga en contacto con nuestra oficina. Randi lakes de fax es [place] [phone].  Si tiene un asunto urgente cuando la clnica est cerrada y que no puede esperar hasta el siguiente da hbil, puede llamar/localizar a su doctor(a) al nmero que aparece a continuacin.   Por favor, tenga en cuenta que aunque hacemos todo lo posible para estar disponibles para asuntos urgentes fuera del horario de [place], no estamos disponibles las 24 horas del da, los 7 [address]  [address] de la [place].   Si tiene un problema urgente y no puede comunicarse con nosotros, puede optar por buscar atencin mdica  en el consultorio de su doctor(a), en una clnica privada, en un centro de atencin urgente o en una sala de emergencias.  Si tiene Engineer, drilling, por favor llame inmediatamente al 911 o vaya a la sala de emergencias.  Nmeros de bper  - Dr. Hester: [phone]  - Dra. Jackquline: [phone]  - Dr. Claudene: [phone]   En caso de inclemencias del tiempo, por favor llame a landry capes principal al [phone] para una actualizacin sobre el [place]  de cualquier retraso o cierre.  Consejos para la medicacin en dermatologa: Por favor, guarde las cajas en las que vienen los medicamentos de uso tpico para ayudarle a seguir las instrucciones sobre dnde y cmo usarlos. Las farmacias generalmente imprimen las instrucciones del medicamento slo en las cajas y no directamente en los tubos del [place].   Si su medicamento es muy caro, por favor, pngase en contacto con landry rieger llamando al [phone] y presione  la opcin 4 o envenos un mensaje a travs de MyChart.   No podemos decirle cul ser su copago por los medicamentos por adelantado ya que esto es diferente dependiendo de la cobertura de su seguro. Sin embargo, es posible que podamos encontrar un medicamento sustituto a Audiological scientist un formulario para que el seguro cubra el medicamento que se considera necesario.   Si se requiere una autorizacin previa para que su compaa de seguros malta su medicamento, por favor permtanos de 1 a 2 das hbiles para completar este proceso.  Los precios de los medicamentos varan con frecuencia dependiendo del Environmental consultant de dnde se surte la receta y alguna farmacias pueden ofrecer precios ms baratos.  El sitio web www.goodrx.com tiene cupones para medicamentos de Health and safety inspector. Los precios aqu no tienen en cuenta lo que podra costar con la ayuda del seguro (puede ser ms barato con su seguro), pero el sitio web puede darle el precio si no utiliz Tourist information centre manager.  - Puede imprimir el cupn correspondiente y llevarlo con su receta a la farmacia.  - Tambin puede pasar por nuestra oficina durante el horario de atencin regular y Education officer, museum una tarjeta de cupones de GoodRx.  - Si necesita que su receta se enve electrnicamente a una farmacia diferente, informe a nuestra oficina a travs de MyChart de [place] o por telfono llamando al [phone] y presione la opcin 4.

## 2023-09-02 ENCOUNTER — Encounter: Payer: Self-pay | Admitting: Dermatology

## 2023-09-02 ENCOUNTER — Ambulatory Visit: Admitting: Dermatology

## 2023-09-02 DIAGNOSIS — L7211 Pilar cyst: Secondary | ICD-10-CM | POA: Diagnosis not present

## 2023-09-02 NOTE — Patient Instructions (Signed)

## 2023-09-02 NOTE — Progress Notes (Signed)
 Follow-Up Visit   Subjective  Kimberly Dorsey is a 68 y.o. female who presents for the following: Excision of cyst at left vertex scalp  The following portions of the chart were reviewed this encounter and updated as appropriate: medications, allergies, medical history  Review of Systems:  No other skin or systemic complaints except as noted in HPI or Assessment and Plan.  Objective  Well appearing patient in no apparent distress; mood and affect are within normal limits.  A focused examination was performed of the following areas: scalp Relevant physical exam findings are noted in the Assessment and Plan.   left vertex scalp, right posterior parietal Subcutaneous nodules at scalp.   Assessment & Plan   PILAR CYST (2) left vertex scalp, right posterior parietal Skin excision - left vertex scalp  Excision method:  elliptical Total excision diameter (cm):  1 Informed consent: discussed and consent obtained   Timeout: patient name, date of birth, surgical site, and procedure verified   Procedure prep:  Patient was prepped and draped in usual sterile fashion Prep type:  Chlorhexidine  Anesthesia: the lesion was anesthetized in a standard fashion   Anesthetic:  1% lidocaine  w/ epinephrine 1-100,000 buffered w/ 8.4% NaHCO3 (6 cc) Instrument used: #15 blade   Hemostasis achieved with: suture, pressure and electrodesiccation   Outcome: patient tolerated procedure well with no complications    Skin repair - left vertex scalp Complexity:  Intermediate Final length (cm):  1.5 Informed consent: discussed and consent obtained   Timeout: patient name, date of birth, surgical site, and procedure verified   Procedure prep:  Patient was prepped and draped in usual sterile fashion Prep type:  Chlorhexidine  Anesthesia: the lesion was anesthetized in a standard fashion   Anesthetic:  1% lidocaine  w/ epinephrine 1-100,000 buffered w/ 8.4% NaHCO3 Reason for type of repair: reduce tension to  allow closure, reduce the risk of dehiscence, infection, and necrosis, reduce subcutaneous dead space and avoid a hematoma, allow closure of the large defect and preserve normal anatomy   Undermining: edges could be approximated without difficulty   Subcutaneous layers (deep stitches):  Suture size:  3-0 and 4-0 Suture type: Monocryl (poliglecaprone 25)   Stitches:  Buried vertical mattress Fine/surface layer approximation (top stitches):  Suture size:  4-0 Suture type: Prolene (polypropylene)   Stitches comment:  Running locked Suture removal (days):  14 Hemostasis achieved with: suture, pressure and electrodesiccation Outcome: patient tolerated procedure well with no complications   Post-procedure details: sterile dressing applied and wound care instructions given   Dressing type: petrolatum, bandage and pressure dressing    Skin excision - right posterior parietal  Excision method:  elliptical Total excision diameter (cm):  0.9 Informed consent: discussed and consent obtained   Timeout: patient name, date of birth, surgical site, and procedure verified   Procedure prep:  Patient was prepped and draped in usual sterile fashion Prep type:  Chlorhexidine  Anesthesia: the lesion was anesthetized in a standard fashion   Anesthetic:  1% lidocaine  w/ epinephrine 1-100,000 buffered w/ 8.4% NaHCO3 (6 cc) Instrument used: #15 blade   Hemostasis achieved with: suture, pressure and electrodesiccation   Outcome: patient tolerated procedure well with no complications    Skin repair - right posterior parietal Complexity:  Intermediate Final length (cm):  1.8 Informed consent: discussed and consent obtained   Timeout: patient name, date of birth, surgical site, and procedure verified   Procedure prep:  Patient was prepped and draped in usual sterile fashion Prep type:  Chlorhexidine  Anesthesia: the lesion was anesthetized in a standard fashion   Anesthetic:  1% lidocaine  w/ epinephrine  1-100,000 buffered w/ 8.4% NaHCO3 Reason for type of repair: reduce tension to allow closure, reduce the risk of dehiscence, infection, and necrosis, reduce subcutaneous dead space and avoid a hematoma, allow closure of the large defect and preserve normal anatomy   Undermining: edges could be approximated without difficulty   Subcutaneous layers (deep stitches):  Suture size:  4-0 Suture type: Monocryl (poliglecaprone 25)   Stitches:  Buried vertical mattress Fine/surface layer approximation (top stitches):  Suture size:  4-0 Suture type: Prolene (polypropylene)   Stitches comment:  Running locked Suture removal (days):  14 Hemostasis achieved with: suture, pressure and electrodesiccation Outcome: patient tolerated procedure well with no complications   Post-procedure details: sterile dressing applied and wound care instructions given   Dressing type: petrolatum, bandage and pressure dressing    Specimen 1 - Surgical pathology Differential Diagnosis: Pilar Cyst vs EIC  Check Margins: No  Specimen 2 - Surgical pathology Differential Diagnosis: Pilar cyst vs EIC  Check Margins: No   Return in about 2 weeks (around 09/16/2023) for Suture Removal.  LILLETTE Lonell Drones, RMA, am acting as scribe for Boneta Sharps, MD .   Documentation: I have reviewed the above documentation for accuracy and completeness, and I agree with the above.  Boneta Sharps, MD

## 2023-09-03 ENCOUNTER — Telehealth: Payer: Self-pay

## 2023-09-03 NOTE — Telephone Encounter (Signed)
 Called patient, doing fine after yesterday's surgery. Keep SR appt as scheduled. Lonell RAMAN., RMA

## 2023-09-07 ENCOUNTER — Ambulatory Visit: Payer: Self-pay | Admitting: Dermatology

## 2023-09-07 LAB — SURGICAL PATHOLOGY

## 2023-09-08 NOTE — Telephone Encounter (Signed)
 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 Patient doing fine. Keep SR appt as scheduled. Lonell RAMAN., RMA

## 2023-09-08 NOTE — Telephone Encounter (Signed)
-----   Message from [facility] sent at 09/07/2023  8:08 PM EDT ----- Diagnosis: 1. Skin (M), left vertex scalp :       PILAR CYST        2. Skin (M), right posterior parietal :       PILAR CYST   Plan: please call to get update on surgical wound and share that scalp biopsies show benign pilar cysts ----- Message ----- From: Interface, [facility] Sent: 09/07/2023   7:21 PM EDT To: Boneta Sharps, MD

## 2023-09-16 ENCOUNTER — Ambulatory Visit: Admitting: Dermatology

## 2023-09-16 DIAGNOSIS — Z4802 Encounter for removal of sutures: Secondary | ICD-10-CM

## 2023-09-16 DIAGNOSIS — Z5189 Encounter for other specified aftercare: Secondary | ICD-10-CM

## 2023-09-16 DIAGNOSIS — Z48817 Encounter for surgical aftercare following surgery on the skin and subcutaneous tissue: Secondary | ICD-10-CM

## 2023-09-16 NOTE — Progress Notes (Unsigned)
   Follow-Up Visit   Subjective  Kimberly Dorsey is a 68 y.o. female who presents for the following: Suture removal of the R post parietal scalp and L vertex scalp  Pathology showed benign pilar cysts  The following portions of the chart were reviewed this encounter and updated as appropriate: medications, allergies, medical history  Review of Systems:  No other skin or systemic complaints except as noted in HPI or Assessment and Plan.  Objective  Well appearing patient in no apparent distress; mood and affect are within normal limits.  Areas Examined: The scalp  Relevant physical exam findings are noted in the Assessment and Plan.    Assessment & Plan   ENCOUNTER FOR REMOVAL OF SUTURES   VISIT FOR WOUND CHECK   Encounter for Removal of Sutures - Incision site is clean, dry and intact. - Wound cleansed, sutures removed, and Vaseline ointment applied to aa's. - Discussed pathology results showing benign pilar cysts - Scars remodel for a full year. - Patient advised to call with any concerns or if they notice any new or changing lesions.  Return for appointment as scheduled.  Kimberly Dorsey, CMA, am acting as scribe for Boneta Sharps, MD .  Documentation: I have reviewed the above documentation for accuracy and completeness, and I agree with the above.  Boneta Sharps, MD

## 2023-09-16 NOTE — Patient Instructions (Signed)

## 2023-10-06 DIAGNOSIS — M5416 Radiculopathy, lumbar region: Secondary | ICD-10-CM | POA: Diagnosis not present

## 2023-10-06 DIAGNOSIS — M8588 Other specified disorders of bone density and structure, other site: Secondary | ICD-10-CM | POA: Diagnosis not present

## 2023-10-06 DIAGNOSIS — D126 Benign neoplasm of colon, unspecified: Secondary | ICD-10-CM | POA: Diagnosis not present

## 2023-10-06 DIAGNOSIS — E039 Hypothyroidism, unspecified: Secondary | ICD-10-CM | POA: Diagnosis not present

## 2023-10-06 DIAGNOSIS — R7301 Impaired fasting glucose: Secondary | ICD-10-CM | POA: Diagnosis not present

## 2023-10-06 DIAGNOSIS — E785 Hyperlipidemia, unspecified: Secondary | ICD-10-CM | POA: Diagnosis not present

## 2023-10-06 DIAGNOSIS — F330 Major depressive disorder, recurrent, mild: Secondary | ICD-10-CM | POA: Diagnosis not present

## 2023-10-28 ENCOUNTER — Encounter: Admitting: Dermatology
